# Patient Record
Sex: Female | Born: 1935 | Race: White | Hispanic: No | Smoking: Former smoker
Health system: Southern US, Community
[De-identification: ages and names within clinical notes are randomized; demographics above are authoritative.]

## PROBLEM LIST (undated history)

## (undated) DIAGNOSIS — Z9981 Dependence on supplemental oxygen: Secondary | ICD-10-CM

## (undated) DIAGNOSIS — J449 Chronic obstructive pulmonary disease, unspecified: Secondary | ICD-10-CM

## (undated) DIAGNOSIS — I2510 Atherosclerotic heart disease of native coronary artery without angina pectoris: Secondary | ICD-10-CM

## (undated) DIAGNOSIS — H3320 Serous retinal detachment, unspecified eye: Secondary | ICD-10-CM

## (undated) DIAGNOSIS — H353110 Nonexudative age-related macular degeneration, right eye, stage unspecified: Secondary | ICD-10-CM

## (undated) DIAGNOSIS — I714 Abdominal aortic aneurysm, without rupture, unspecified: Secondary | ICD-10-CM

## (undated) DIAGNOSIS — J189 Pneumonia, unspecified organism: Secondary | ICD-10-CM

## (undated) DIAGNOSIS — G8929 Other chronic pain: Secondary | ICD-10-CM

## (undated) DIAGNOSIS — Z8719 Personal history of other diseases of the digestive system: Secondary | ICD-10-CM

## (undated) DIAGNOSIS — R296 Repeated falls: Secondary | ICD-10-CM

## (undated) DIAGNOSIS — K648 Other hemorrhoids: Secondary | ICD-10-CM

## (undated) DIAGNOSIS — M1990 Unspecified osteoarthritis, unspecified site: Secondary | ICD-10-CM

## (undated) DIAGNOSIS — F329 Major depressive disorder, single episode, unspecified: Secondary | ICD-10-CM

## (undated) DIAGNOSIS — J42 Unspecified chronic bronchitis: Secondary | ICD-10-CM

## (undated) DIAGNOSIS — G2581 Restless legs syndrome: Secondary | ICD-10-CM

## (undated) DIAGNOSIS — I509 Heart failure, unspecified: Secondary | ICD-10-CM

## (undated) DIAGNOSIS — N183 Chronic kidney disease, stage 3 unspecified: Secondary | ICD-10-CM

## (undated) DIAGNOSIS — I739 Peripheral vascular disease, unspecified: Secondary | ICD-10-CM

## (undated) DIAGNOSIS — F419 Anxiety disorder, unspecified: Secondary | ICD-10-CM

## (undated) DIAGNOSIS — I712 Thoracic aortic aneurysm, without rupture, unspecified: Secondary | ICD-10-CM

## (undated) DIAGNOSIS — M519 Unspecified thoracic, thoracolumbar and lumbosacral intervertebral disc disorder: Secondary | ICD-10-CM

## (undated) DIAGNOSIS — I729 Aneurysm of unspecified site: Secondary | ICD-10-CM

## (undated) DIAGNOSIS — I4891 Unspecified atrial fibrillation: Secondary | ICD-10-CM

## (undated) DIAGNOSIS — E782 Mixed hyperlipidemia: Secondary | ICD-10-CM

## (undated) DIAGNOSIS — E611 Iron deficiency: Secondary | ICD-10-CM

## (undated) DIAGNOSIS — G459 Transient cerebral ischemic attack, unspecified: Secondary | ICD-10-CM

## (undated) DIAGNOSIS — Z87442 Personal history of urinary calculi: Secondary | ICD-10-CM

## (undated) DIAGNOSIS — H548 Legal blindness, as defined in USA: Secondary | ICD-10-CM

## (undated) DIAGNOSIS — I10 Essential (primary) hypertension: Secondary | ICD-10-CM

## (undated) DIAGNOSIS — K5790 Diverticulosis of intestine, part unspecified, without perforation or abscess without bleeding: Secondary | ICD-10-CM

## (undated) DIAGNOSIS — G43909 Migraine, unspecified, not intractable, without status migrainosus: Secondary | ICD-10-CM

## (undated) DIAGNOSIS — I350 Nonrheumatic aortic (valve) stenosis: Secondary | ICD-10-CM

## (undated) DIAGNOSIS — M549 Dorsalgia, unspecified: Secondary | ICD-10-CM

## (undated) DIAGNOSIS — K219 Gastro-esophageal reflux disease without esophagitis: Secondary | ICD-10-CM

## (undated) DIAGNOSIS — I779 Disorder of arteries and arterioles, unspecified: Secondary | ICD-10-CM

## (undated) DIAGNOSIS — F32A Depression, unspecified: Secondary | ICD-10-CM

## (undated) HISTORY — PX: TONSILLECTOMY: SUR1361

## (undated) HISTORY — PX: CARDIAC CATHETERIZATION: SHX172

## (undated) HISTORY — DX: Nonrheumatic aortic (valve) stenosis: I35.0

## (undated) HISTORY — DX: Essential (primary) hypertension: I10

## (undated) HISTORY — DX: Iron deficiency: E61.1

## (undated) HISTORY — DX: Gastro-esophageal reflux disease without esophagitis: K21.9

## (undated) HISTORY — DX: Transient cerebral ischemic attack, unspecified: G45.9

## (undated) HISTORY — DX: Disorder of arteries and arterioles, unspecified: I77.9

## (undated) HISTORY — DX: Legal blindness, as defined in USA: H54.8

## (undated) HISTORY — DX: Peripheral vascular disease, unspecified: I73.9

## (undated) HISTORY — PX: BACK SURGERY: SHX140

## (undated) HISTORY — PX: EYE SURGERY: SHX253

## (undated) HISTORY — DX: Unspecified atrial fibrillation: I48.91

## (undated) HISTORY — DX: Unspecified thoracic, thoracolumbar and lumbosacral intervertebral disc disorder: M51.9

## (undated) HISTORY — DX: Atherosclerotic heart disease of native coronary artery without angina pectoris: I25.10

## (undated) HISTORY — PX: TOTAL ABDOMINAL HYSTERECTOMY: SHX209

## (undated) HISTORY — DX: Abdominal aortic aneurysm, without rupture, unspecified: I71.40

## (undated) HISTORY — DX: Chronic obstructive pulmonary disease, unspecified: J44.9

## (undated) HISTORY — PX: APPENDECTOMY: SHX54

## (undated) HISTORY — PX: CARDIAC VALVE REPLACEMENT: SHX585

## (undated) HISTORY — DX: Restless legs syndrome: G25.81

## (undated) HISTORY — DX: Mixed hyperlipidemia: E78.2

## (undated) HISTORY — DX: Other hemorrhoids: K64.8

## (undated) HISTORY — PX: CHOLECYSTECTOMY OPEN: SUR202

## (undated) HISTORY — DX: Abdominal aortic aneurysm, without rupture: I71.4

## (undated) HISTORY — DX: Diverticulosis of intestine, part unspecified, without perforation or abscess without bleeding: K57.90

---

## 1998-11-08 ENCOUNTER — Inpatient Hospital Stay (HOSPITAL_COMMUNITY): Admission: EM | Admit: 1998-11-08 | Discharge: 1998-11-11 | Payer: Self-pay | Admitting: Cardiology

## 1999-12-17 ENCOUNTER — Encounter: Payer: Self-pay | Admitting: Specialist

## 1999-12-17 ENCOUNTER — Encounter: Admission: RE | Admit: 1999-12-17 | Discharge: 1999-12-17 | Payer: Self-pay | Admitting: Specialist

## 2001-03-11 HISTORY — PX: LUMBAR LAMINECTOMY/DECOMPRESSION MICRODISCECTOMY: SHX5026

## 2001-03-21 ENCOUNTER — Encounter: Payer: Self-pay | Admitting: Specialist

## 2001-03-31 ENCOUNTER — Encounter: Payer: Self-pay | Admitting: Specialist

## 2001-03-31 ENCOUNTER — Inpatient Hospital Stay (HOSPITAL_COMMUNITY): Admission: RE | Admit: 2001-03-31 | Discharge: 2001-04-03 | Payer: Self-pay | Admitting: Specialist

## 2001-04-01 ENCOUNTER — Encounter: Payer: Self-pay | Admitting: Specialist

## 2003-08-07 ENCOUNTER — Ambulatory Visit (HOSPITAL_COMMUNITY): Admission: RE | Admit: 2003-08-07 | Discharge: 2003-08-08 | Payer: Self-pay | Admitting: Cardiology

## 2003-09-09 HISTORY — PX: AORTIC VALVE REPLACEMENT: SHX41

## 2003-09-09 HISTORY — PX: MULTIPLE EXTRACTIONS WITH ALVEOLOPLASTY: SHX5342

## 2003-09-10 ENCOUNTER — Encounter: Admission: RE | Admit: 2003-09-10 | Discharge: 2003-09-10 | Payer: Self-pay | Admitting: Dentistry

## 2003-09-12 ENCOUNTER — Ambulatory Visit (HOSPITAL_COMMUNITY): Admission: RE | Admit: 2003-09-12 | Discharge: 2003-09-12 | Payer: Self-pay | Admitting: Dentistry

## 2003-09-12 ENCOUNTER — Encounter (INDEPENDENT_AMBULATORY_CARE_PROVIDER_SITE_OTHER): Payer: Self-pay | Admitting: Specialist

## 2003-09-17 ENCOUNTER — Ambulatory Visit (HOSPITAL_COMMUNITY)
Admission: RE | Admit: 2003-09-17 | Discharge: 2003-09-17 | Payer: Self-pay | Admitting: Thoracic Surgery (Cardiothoracic Vascular Surgery)

## 2003-10-01 ENCOUNTER — Encounter (INDEPENDENT_AMBULATORY_CARE_PROVIDER_SITE_OTHER): Payer: Self-pay | Admitting: *Deleted

## 2003-10-01 ENCOUNTER — Inpatient Hospital Stay (HOSPITAL_COMMUNITY)
Admission: RE | Admit: 2003-10-01 | Discharge: 2003-10-11 | Payer: Self-pay | Admitting: Thoracic Surgery (Cardiothoracic Vascular Surgery)

## 2003-11-13 ENCOUNTER — Ambulatory Visit: Payer: Self-pay | Admitting: Dentistry

## 2004-03-04 ENCOUNTER — Ambulatory Visit: Payer: Self-pay | Admitting: Cardiology

## 2004-06-09 ENCOUNTER — Inpatient Hospital Stay (HOSPITAL_COMMUNITY): Admission: AD | Admit: 2004-06-09 | Discharge: 2004-06-12 | Payer: Self-pay | Admitting: Cardiology

## 2004-06-09 ENCOUNTER — Ambulatory Visit: Payer: Self-pay | Admitting: Cardiology

## 2004-06-10 ENCOUNTER — Encounter: Payer: Self-pay | Admitting: Cardiology

## 2004-06-11 ENCOUNTER — Ambulatory Visit: Payer: Self-pay | Admitting: Pulmonary Disease

## 2004-06-11 ENCOUNTER — Ambulatory Visit: Payer: Self-pay | Admitting: Cardiovascular Disease

## 2004-06-25 ENCOUNTER — Ambulatory Visit: Payer: Self-pay | Admitting: Cardiology

## 2004-07-20 ENCOUNTER — Ambulatory Visit: Payer: Self-pay | Admitting: Internal Medicine

## 2005-04-12 ENCOUNTER — Ambulatory Visit: Payer: Self-pay | Admitting: Cardiology

## 2005-08-31 ENCOUNTER — Ambulatory Visit: Payer: Self-pay | Admitting: Cardiology

## 2005-09-08 ENCOUNTER — Ambulatory Visit: Payer: Self-pay | Admitting: Cardiology

## 2006-06-02 ENCOUNTER — Ambulatory Visit: Payer: Self-pay | Admitting: Cardiology

## 2007-05-02 ENCOUNTER — Ambulatory Visit: Payer: Self-pay | Admitting: Cardiology

## 2007-06-27 ENCOUNTER — Ambulatory Visit: Payer: Self-pay | Admitting: Cardiology

## 2007-12-25 ENCOUNTER — Encounter: Payer: Self-pay | Admitting: Cardiology

## 2008-03-19 ENCOUNTER — Encounter: Payer: Self-pay | Admitting: Cardiology

## 2008-03-26 ENCOUNTER — Encounter: Payer: Self-pay | Admitting: Cardiology

## 2008-07-01 ENCOUNTER — Ambulatory Visit: Payer: Self-pay | Admitting: Cardiology

## 2008-07-15 ENCOUNTER — Ambulatory Visit: Payer: Self-pay | Admitting: Cardiology

## 2008-07-22 ENCOUNTER — Encounter: Payer: Self-pay | Admitting: Cardiology

## 2008-10-04 ENCOUNTER — Encounter: Payer: Self-pay | Admitting: Cardiology

## 2008-11-06 DIAGNOSIS — R0989 Other specified symptoms and signs involving the circulatory and respiratory systems: Secondary | ICD-10-CM

## 2008-11-06 DIAGNOSIS — E785 Hyperlipidemia, unspecified: Secondary | ICD-10-CM | POA: Insufficient documentation

## 2008-11-06 DIAGNOSIS — I2510 Atherosclerotic heart disease of native coronary artery without angina pectoris: Secondary | ICD-10-CM

## 2009-06-15 ENCOUNTER — Encounter: Payer: Self-pay | Admitting: Cardiology

## 2009-06-16 ENCOUNTER — Encounter: Payer: Self-pay | Admitting: Cardiology

## 2009-06-26 ENCOUNTER — Ambulatory Visit: Payer: Self-pay | Admitting: Cardiology

## 2009-06-26 DIAGNOSIS — F17200 Nicotine dependence, unspecified, uncomplicated: Secondary | ICD-10-CM | POA: Insufficient documentation

## 2009-06-26 DIAGNOSIS — R079 Chest pain, unspecified: Secondary | ICD-10-CM | POA: Insufficient documentation

## 2009-06-30 ENCOUNTER — Encounter: Payer: Self-pay | Admitting: Cardiology

## 2010-02-06 ENCOUNTER — Ambulatory Visit: Payer: Self-pay | Admitting: Cardiology

## 2010-02-17 ENCOUNTER — Encounter: Payer: Self-pay | Admitting: Cardiology

## 2010-02-17 DIAGNOSIS — Z952 Presence of prosthetic heart valve: Secondary | ICD-10-CM | POA: Insufficient documentation

## 2010-03-12 NOTE — Assessment & Plan Note (Signed)
Summary: 6 MO FU PER NOV REMINDER   Visit Type:  Follow-up Primary Provider:  Dr. Kirstie Peri   History of Present Illness: 77 year old woman presents for followup. She was seen back in May 2011. At that time she was reporting some chronic musculoskeletal discomfort in her sternal region following prior surgery, and a followup CT scan of the chest was obtained. This indicated no evidence of sternal malunion. There was a subcutaneous nodule noted adjacent to a sternotomy wire, possibly a granuloma and overall nonspecific. Results were forwarded to Dr. Sherryll Burger.  She reports 2 episodes of pneumonia back in the fall, last hospitalization in October. She states she quit smoking at that time, has had a few slips since then, but overall has done fairly well. I discussed with her nicotine replacement p.r.n.   She is not reporting any angina. Reports compliance with her medications. She is due for followup with Dr. Sherryll Burger to have lab work.  Preventive Screening-Counseling & Management  Alcohol-Tobacco     Smoking Status: quit     Packs/Day: 2 PPD     Year Quit: 11/2009     Pack years: 60years  Current Medications (verified): 1)  Aspir-Low 81 Mg Tbec (Aspirin) .... Take 1 Tablet By Mouth Once A Day 2)  Gabapentin 300 Mg Caps (Gabapentin) .... Take 1 Tablet By Mouth Two Times A Day 3)  Furosemide 40 Mg Tabs (Furosemide) .... Take 1 Tablet By Mouth Once A Day 4)  Proventil Hfa 108 (90 Base) Mcg/act Aers (Albuterol Sulfate) .... Two Puffs Two Times A Day 5)  Citalopram Hydrobromide 40 Mg Tabs (Citalopram Hydrobromide) .... Take 1 Tablet By Mouth Once A Day 6)  Simvastatin 40 Mg Tabs (Simvastatin) .... Take 1 Tablet By Mouth Once A Day 7)  Omeprazole 20 Mg Cpdr (Omeprazole) .... Take 1 Tablet By Mouth Two Times A Day 8)  Ropinirole Hcl 0.5 Mg Tabs (Ropinirole Hcl) .... Take 1 Tablet By Mouth Three Times A Day 9)  Isosorbide Mononitrate Cr 30 Mg Xr24h-Tab (Isosorbide Mononitrate) .... Take 1 Tablet By  Mouth Once A Day 10)  Nitrostat 0.4 Mg Subl (Nitroglycerin) .... Use As Directed 11)  Drisdol 16109 Unit Caps (Ergocalciferol) .... Take One By Mouth Biweekly Times 62mths,then Recheck Level 12)  Fish Oil 1000 Mg Caps (Omega-3 Fatty Acids) .... Take 1 Tablet By Mouth Two Times A Day 13)  Hydrocodone-Acetaminophen 5-500 Mg Tabs (Hydrocodone-Acetaminophen) .... Take One By Mouth Every 4hours As Needed  Allergies (verified): 1)  ! Morphine  Comments:  Nurse/Medical Assistant: The patient is currently on medications but does not know the name or dosage at this time. Instructed to contact our office with details. Will update medication list at that time.  Past History:  Past Medical History: Last updated: 06/24/2009 CAD - nonobstructive 2005 C O P D Hyperlipidemia Aortic Stenosis Anxiety G E R D Left eye blindness - detached retina  Past Surgical History: Last updated: 06/24/2009 Abdominal Hysterectomy-Total Appendectomy Back Surgery Cholecystectomy Aortic valve replacement 8/05 - #19 mm Edwards bovine pericardial prosthesis  Social History: Last updated: 06/24/2009 Retired  Widowed  Tobacco Use - Yes Alcohol Use - no  Social History: Smoking Status:  quit Pack years:  60years Packs/Day:  2 PPD  Review of Systems       The patient complains of dyspnea on exertion.  The patient denies anorexia, fever, chest pain, syncope, peripheral edema, prolonged cough, hemoptysis, melena, and hematochezia.         Otherwise reviewed and negative.  Vital Signs:  Patient profile:   77 year old female Height:      61 inches Weight:      137 pounds Pulse rate:   84 / minute BP sitting:   112 / 72  (left arm) Cuff size:   regular  Vitals Entered By: Carlye Grippe (February 17, 2010 2:36 PM)  Physical Exam  Additional Exam:  Chronically ill-appearing elderly woman in no acute distress. HEENT: Conjunctiva and lids normal, oropharynx with poor dentition. Neck: Supple, bilateral  carotid bruits as noted previously, no elevated JVP. Lungs: Diminished breath sounds throughout, nonlabored. Thorax: Mildly tender to palpation of the sternum. Probable palpation of the wire but no gross instability. Cardiac: Regular rate and rhythm, 2/6 systolic murmur, no S3. Abdomen: Soft, nontender, bowel sounds present. Extremities: No pitting edema, distal pulses diminished. Skin: Warm and dry. Musculoskeletal: Mild kyphosis. Neuropsychiatric: Alert and oriented x3, affect appropriate.   EKG  Procedure date:  02/17/2010  Findings:      Normal sinus rhythm at 84 beats per minute.  Impression & Recommendations:  Problem # 1:  CAD, NATIVE VESSEL (ICD-414.01)  No active angina with history of nonobstructive disease. Continue medical therapy and observation.  Her updated medication list for this problem includes:    Aspir-low 81 Mg Tbec (Aspirin) .Marland Kitchen... Take 1 tablet by mouth once a day    Isosorbide Mononitrate Cr 30 Mg Xr24h-tab (Isosorbide mononitrate) .Marland Kitchen... Take 1 tablet by mouth once a day    Nitrostat 0.4 Mg Subl (Nitroglycerin) ..... Use as directed  Orders: EKG w/ Interpretation (93000)  Problem # 2:  AORTIC VALVE REPLACEMENT, HX OF (ICD-V43.3)  History of bioprosthetic aortic valve replacement in 2005, overall stable symptomatically.  Problem # 3:  TOBACCO ABUSE (ICD-305.1)  Patient reports that she quit smoking back in October. We discussed urges, also nicotine replacement.  Problem # 4:  HYPERLIPIDEMIA-MIXED (ICD-272.4)  Due for followup Dr. Sherryll Burger.  Her updated medication list for this problem includes:    Simvastatin 40 Mg Tabs (Simvastatin) .Marland Kitchen... Take 1 tablet by mouth once a day  Patient Instructions: 1)  Your physician wants you to follow-up in: 6 months. You will receive a reminder letter in the mail one-two months in advance. If you don't receive a letter, please call our office to schedule the follow-up appointment. 2)  Your physician recommends that  you continue on your current medications as directed. Please refer to the Current Medication list given to you today.

## 2010-03-12 NOTE — Assessment & Plan Note (Signed)
Summary: 1 yr fu -recv reminder vs   Visit Type:  Follow-up Primary Provider:  Dr. Kirstie Peri   History of Present Illness: 77 year old woman presents for followup. She is here with her granddaughter. She reports no problems with angina. She is chronically short of breath and uses oxygen at nighttime with history of significant lung disease. She unfortunately has not been able to quit smoking. We have discussed this several times.  She reports a chronic musculoskeletal discomfort in her sternal area "ever since surgery."  Recent followup labs in May revealed BUN 9, creatinine 0.8, AST 17, ALT 11, total cholesterol 132, triglycerides 51, HDL 51, LDL 71, TSH 1.4, potassium 4.3, hemoglobin 13.5, platelets 214.  Carotid Dopplers from June 2010 revealed only mild plaque bilaterally, not hemodynamically significant.  She is limited in her functional capabilities and does not exercise.  Preventive Screening-Counseling & Management  Alcohol-Tobacco     Smoking Status: current     Smoking Cessation Counseling: yes     Packs/Day: <1PPD  Current Medications (verified): 1)  Aspir-Low 81 Mg Tbec (Aspirin) .... Take 1 Tablet By Mouth Once A Day 2)  Gabapentin 300 Mg Caps (Gabapentin) .... Take 1 Tablet By Mouth Two Times A Day 3)  Furosemide 40 Mg Tabs (Furosemide) .... Take 1 Tablet By Mouth Once A Day 4)  Proventil Hfa 108 (90 Base) Mcg/act Aers (Albuterol Sulfate) .... Two Puffs Two Times A Day 5)  Citalopram Hydrobromide 40 Mg Tabs (Citalopram Hydrobromide) .... Take 1 Tablet By Mouth Once A Day 6)  Simvastatin 40 Mg Tabs (Simvastatin) .... Take 1 Tablet By Mouth Once A Day 7)  Omeprazole 20 Mg Cpdr (Omeprazole) .... Take 1 Tablet By Mouth Two Times A Day 8)  Ropinirole Hcl 0.5 Mg Tabs (Ropinirole Hcl) .... Take 1 Tablet By Mouth Three Times A Day 9)  Isosorbide Mononitrate Cr 30 Mg Xr24h-Tab (Isosorbide Mononitrate) .... Take 1 Tablet By Mouth Once A Day 10)  Nitrostat 0.4 Mg Subl  (Nitroglycerin) .... Use As Directed 11)  Drisdol 29528 Unit Caps (Ergocalciferol) .... Take One By Mouth Biweekly Times 58mths,then Recheck Level 12)  Fish Oil 1000 Mg Caps (Omega-3 Fatty Acids) .... Take 1 Tablet By Mouth Two Times A Day 13)  Hydrocodone-Acetaminophen 5-500 Mg Tabs (Hydrocodone-Acetaminophen) .... Take One By Mouth Every 4hours As Needed  Allergies: 1)  ! Morphine  Comments:  Nurse/Medical Assistant: The patient's medications and allergies were reviewed with the patient and were updated in the Medication and Allergy Lists. List reviewed.  Past History:  Past Medical History: Last updated: 06/24/2009 CAD - nonobstructive 2005 C O P D Hyperlipidemia Aortic Stenosis Anxiety G E R D Left eye blindness - detached retina  Past Surgical History: Last updated: 06/24/2009 Abdominal Hysterectomy-Total Appendectomy Back Surgery Cholecystectomy Aortic valve replacement 8/05 - #19 mm Edwards bovine pericardial prosthesis  Social History: Last updated: 06/24/2009 Retired  Widowed  Tobacco Use - Yes Alcohol Use - no  Social History: Packs/Day:  <1PPD  Review of Systems       The patient complains of chest pain and dyspnea on exertion.  The patient denies anorexia, fever, syncope, peripheral edema, prolonged cough, headaches, abdominal pain, melena, and hematochezia.         Otherwise reviewed and negative.  Vital Signs:  Patient profile:   77 year old female Height:      61 inches Weight:      147 pounds BMI:     27.88 Pulse rate:   66 /  minute BP sitting:   127 / 72  (left arm) Cuff size:   regular  Vitals Entered By: Carlye Grippe (Jun 26, 2009 11:03 AM)  Nutrition Counseling: Patient's BMI is greater than 25 and therefore counseled on weight management options.  Physical Exam  Additional Exam:  Chronically ill-appearing elderly woman in no acute distress. HEENT: Conjunctiva and lids normal, oropharynx with poor dentition. Neck: Supple,  bilateral carotid bruits as noted previously, no elevated JVP. Lungs: Diminished breath sounds throughout, nonlabored. Thorax: Mildly tender to palpation of the sternum. Probable palpation of the wire but no gross instability. Cardiac: Regular rate and rhythm, 2/6 systolic murmur, no S3. Abdomen: Soft, nontender, bowel sounds present. Extremities: No pitting edema, distal pulses diminished. Skin: Warm and dry. Musculoskeletal: Mild kyphosis. Neuropsychiatric: Alert and oriented x3, affect appropriate.   EKG  Procedure date:  06/26/2009  Findings:      Sinus rhythm at 69 beats per minute with nonspecific ST changes.  Impression & Recommendations:  Problem # 1:  CAD, NATIVE VESSEL (ICD-414.01)  Nonobstructive disease at catheterization prior to valve surgery in 2005, with no active angina. Plan to continue medical therapy and observation with followup over the next 6 months.  Her updated medication list for this problem includes:    Aspir-low 81 Mg Tbec (Aspirin) .Marland Kitchen... Take 1 tablet by mouth once a day    Isosorbide Mononitrate Cr 30 Mg Xr24h-tab (Isosorbide mononitrate) .Marland Kitchen... Take 1 tablet by mouth once a day    Nitrostat 0.4 Mg Subl (Nitroglycerin) ..... Use as directed  Orders: EKG w/ Interpretation (93000) CT Scan without Contrast (CT w/o contrast)  Problem # 2:  HYPERLIPIDEMIA-MIXED (ICD-272.4)  Lipids well-controlled on present regimen.  Her updated medication list for this problem includes:    Simvastatin 40 Mg Tabs (Simvastatin) .Marland Kitchen... Take 1 tablet by mouth once a day  Problem # 3:  BRUIT (ICD-785.9)  Bilateral carotid bruits with no evidence of significant stenosis by serial carotid Dopplers over time.  Problem # 4:  TOBACCO ABUSE (ICD-305.1)  Patient continues to smoke cigarettes. She's been counseled about smoking cessation on many occasions and has been unable to quit.  Problem # 5:  CHEST PAIN UNSPECIFIED (ICD-786.50)  Musculoskeletal, sternal discomfort  since surgery. Could be a wire fracture, doubt sternal nonunion. A noncontrasted chest CT will be scheduled to further investigate this.  Her updated medication list for this problem includes:    Aspir-low 81 Mg Tbec (Aspirin) .Marland Kitchen... Take 1 tablet by mouth once a day    Isosorbide Mononitrate Cr 30 Mg Xr24h-tab (Isosorbide mononitrate) .Marland Kitchen... Take 1 tablet by mouth once a day    Nitrostat 0.4 Mg Subl (Nitroglycerin) ..... Use as directed  Patient Instructions: 1)  Your physician wants you to follow-up in: 6 months. You will receive a reminder letter in the mail one-two months in advance. If you don't receive a letter, please call our office to schedule the follow-up appointment. 2)  Non-Cardiac CT scanning, (CAT scanning), is a noninvasive, special x-ray that produces cross-sectional images of the body using x-rays and a computer. CT scans help physicians diagnose and treat medical conditions. For some CT exams, a contrast material is used to enhance visibility in the area of the body being studied. CT scans provide greater clarity and reveal more details than regular x-ray exams. 3)  Your physician recommends that you continue on your current medications as directed. Please refer to the Current Medication list given to you today.

## 2010-03-18 ENCOUNTER — Encounter: Payer: Self-pay | Admitting: Cardiology

## 2010-06-23 NOTE — Assessment & Plan Note (Signed)
[facility]                          [facility] OFFICE NOTE   ELEXIA, FRIEDT                    MRN:          [id number]  DATE:07/01/2008                            DOB:          1927/12/13    PRIMARY CARE PHYSICIAN:  Kirstie Peri, MD   REASON FOR VISIT:  Routine followup.   HISTORY OF PRESENT ILLNESS:  I saw [name] in May of last year.  She  has a history of aortic stenosis, status post bioprosthetic aortic valve  replacement with documentation of nonobstructive cardiovascular disease.  She does have severe chronic obstructive pulmonary disease with ongoing  tobacco abuse.  I have talked about smoking cessation with her on a  number of occasions.  She denies any problems with exertional angina.  Since I last saw her, she has had declining vision.  She is somewhat  unsteady on her feet and uses a cane.  Although states that she has had  no falls.  I reviewed her medications today, which are largely  unchanged.  She has not had a followup lipid profile and liver function  tests.  Her last echocardiogram was approximately a year and half ago.   ALLERGIES:  MORPHINE.   PRESENT MEDICATIONS:  1. Aspirin 81 mg p.o. daily.  2. Gabapentin 300 mg p.o. b.i.d.  3. Lasix 40 mg p.o. daily.  4. Albuterol twice daily.  5. Citalopram 40 mg p.o. daily.  6. Simvastatin 40 mg p.o. daily.  7. Omeprazole 20 mg p.o. b.i.d.  8. Ropinirole 0.5 mg p.o. t.i.d.  9. Imdur 30 mg p.o. daily.  10.Sublingual nitroglycerin 0.4 mg p.r.n.   REVIEW OF SYSTEMS:  As outlined above.  She has had some left shoulder  difficulty, otherwise reviewed and are negative.   PHYSICAL EXAMINATION:  VITAL SIGNS:  Blood pressure 108/63, heart rate  is 74, and weight is 135 pounds.  GENERAL:  The patient is comfortable in no acute distress.  HEENT:  Conjunctiva is normal.  Oropharynx is clear.  NECK:  Supple.  No elevated jugular venous pressure.  Bilateral carotid  bruits  are noted.  Also, radiation of systolic murmur on the right.  No  thyromegaly.  LUNGS:  Clear with significantly diminished breath sounds.  No wheezing  noted.  CARDIAC:  Regular rate and rhythm with a 3/6 systolic murmur heard at  the base, second heart sound is preserved.  PMI is nondisplaced.  ABDOMEN:  Soft, nontender.  EXTREMITIES:  No frank pitting edema.  Distal pulses are 1+.  SKIN:  Warm and dry.  MUSCULOSKELETAL:  Mild kyphosis noted.  NEUROPSYCHIATRIC:  The patient is alert and oriented x3.  Affect is  appropriate.   A 12-lead electrocardiogram today shows normal sinus rhythm with left  atrial enlargement, nonspecific T-wave changes, and no major changes  compared to her previous tracing from 2009.   IMPRESSION AND RECOMMENDATIONS:  1. Nonobstructive cardiovascular disease with normal ejection      fraction.  Ms. Goetzke is not reporting any significant angina.  Plan will be to continue medical therapy and efforts at risk factor  modification.  1.  Chronic obstructive pulmonary disease with ongoing tobacco abuse.      I continue to recommend smoking cessation.  She has not been able      to quit.  2. Aortic valve disease, status post bioprosthetic aortic valve      replacement.  We will plan a followup 2-D echocardiogram to      reassess valve gradients.  3. Hyperlipidemia, on statin therapy.  Followup lipid profile and      liver function tests will be arranged.  4. Carotid bruits with previous documentation of no significant      obstructive carotid disease as of 2003.  She also has radiation of      a systolic murmur to this area.  To further clarify, we will repeat      carotid Dopplers.     Jonelle Sidle, MD  Electronically Signed    SGM/MedQ  DD: 07/01/2008  DT: 07/02/2008  Job #: [id number]   cc:   Kirstie Peri, MD

## 2010-06-23 NOTE — Assessment & Plan Note (Signed)
[facility]                          [facility]ICE NOTE   [name], [name]                    MRN:          [id number]  DATE:06/27/2007                            DOB:          17-Mar-1927    PRIMARY CARE PHYSICIAN:  Dr. Kirstie Peri.   REASON FOR VISIT:  Cardiac followup.   HISTORY OF PRESENT ILLNESS:  [name] comes in for a routine visit.  She has a history of aortic valve disease, status post bioprosthetic  aortic valve replacement as well as nonobstructive coronary  atherosclerosis.  She was hospitalized briefly back in late March and  early April with early April with atypical chest pain.  She does have  baseline severe chronic obstructive pulmonary disease with continued  tobacco abuse.  She states that overall she has done well.  She has been  working outside some recently in her garden.  Her electromyogram today  shows normal sinus rhythm with left atrial enlargement and nonspecific T-  wave changes.  She did have an echocardiogram done recently on March 24;  this revealed an ejection fraction of 60% to 65% with mild to moderate  left atrial enlargement, trace mitral regurgitation and a stable  bioprosthetic aortic valve with a mean gradient of 22 mmHg and trace  aortic regurgitation.  A trivial pericardial effusion was also noted.  Recent lower extremity arterial studies revealed no evidence of  significant occlusive disease with normal ankle brachial indices.  She  is not having any claudication.   ALLERGIES:  MORPHINE.   PRESENT MEDICATIONS:  1. Gabapentin 300 mg p.o. b.i.d.  2. Lasix 40 mg p.o. daily.  3. Aspirin 81 mg p.o. daily.  4. Albuterol twice daily.  5. Citalopram 40 mg p.o. daily.  6. Simvastatin 40 mg p.o.  7. Omeprazole 20 mg p.o. b.i.d.  8. Requip 0.5 mg p.o. t.i.d.  9. She is also taking both Imdur 30 mg p.o. and isosorbide mononitrate      20 mg p.o. daily.  10.Sublingual nitroglycerin 0.4 mg  p.r.n.   REVIEW OF SYSTEMS:  As described in history of present illness.  No  cough, hemoptysis, fevers, chills.  No lower extremity edema,  palpitations or syncope.  Otherwise negative.   EXAMINATION:  Blood pressure is 120/64, heart rate is 73, weight is  132.8 pounds.  The patient is comfortable in no acute distress.  HEENT:  Conjunctivae are normal.  Oropharynx clear with poor dentition.  NECK:  Supple.  No loud carotid bruits.  LUNGS:  With significantly diminished breath sounds throughout.  No  wheezing or labored breathing.  CARDIAC:  Exam reveals a regular rate and rhythm and 2/6 systolic murmur  at the base, no S3 gallop or loud diastolic murmur.  ABDOMEN:  Soft,  nontender.  EXTREMITIES:  Exhibit no significant pitting edema.   IMPRESSION AND RECOMMENDATIONS:  1. History of aortic valve disease, status post bioprosthetic aortic      valve replacement.  This is stable by recent echocardiography.  2. Nonobstructive coronary disease with preserved ejection fraction.      The patient is denying any angina.  She will continue on aspirin,      Zocor, and Imdur 30 mg daily.  I asked her to discontinue the other      nitroglycerin formulation.  She is not on a beta blocker at this      time, although did tolerate it tolerate in the past.  Her chronic      lung disease limits this.     Jonelle Sidle, MD  Electronically Signed    SGM/MedQ  DD: 06/27/2007  DT: 06/27/2007  Job #: [id number]   cc:   Kirstie Peri, MD

## 2010-06-26 NOTE — Discharge Summary (Signed)
NAME:  [name], [name] NO.:  [id number]   MEDICAL RECORD NO.:  [id number]                   PATIENT TYPE:  OIB   LOCATION:  5524                                 FACILITY:  [facility]   PHYSICIAN:  Learta Codding, M.D. [facility]             DATE OF BIRTH:  30-Oct-1927   DATE OF ADMISSION:  08/07/2003  DATE OF DISCHARGE:  08/08/2003                                 DISCHARGE SUMMARY   SUMMARY OF HISTORY:  [name] is a 77 year old female who was seen in the  office on June 23 for vague-like dizzy spells not associated with any actual  loss of consciousness.  She has not had any chest discomfort, but she does  have dyspnea on exertion which is moderate in intensity.  Dr. Jens Som  initially when he saw her in May referred her for an echocardiogram, which  showed an ejection fraction of 65-75% with grade 1 diastolic dysfunction,  mild MR, moderately calcified aortic valve with evidence of severe aortic  stenosis, with mild to moderate AI.  A Cardiolite showed an EF of 57% with  inferolateral defect, which was partially reversible, suggestive of  ischemia.  With these symptoms and findings on her previous-mentioned tests,  it is felt that she should undergo right and left heart catheterization,  thus her admission.  It is noted that she has a history of a heart  catheterization back in 2000, but the results are not available.   Her history is notable for COPD, hyperlipidemia, and aortic stenosis.   LABORATORY DATA:  At [facility], H&H was 13.7 and 40.5, normal  indices, platelets 305, WBC 5.7.  PT 12.5, PTT 29.  Sodium 138, potassium  4.2, BUN 12, creatinine 0.8, glucose 102.  Chest x-ray showed slight  hyperinflation without evidence of pneumonia or pulmonary edema.  EKG showed  normal sinus rhythm, early R-wave, normal axis, nonspecific ST-T wave  changes.   HOSPITAL COURSE:  Cardiac catheterization was performed on August 07, 2003, by  Dr. Diona Browner.  It is  noted that she has large coronary arteries with minor  irregularities.  She had a 20% proximal RCA and left ventriculogram was not  performed.  Please refer to dictated note for pressures.  Post  catheterization she had been very restless despite bed rest.  Dr. Diona Browner  felt that she needed to be observed overnight to observe for right groin  problems.  Post sheath removal on bed rest, she was moving about without  difficulty.  Dr. Andee Lineman saw the patient on the morning of the 30th and felt  that she could be discharged home.  It was noted that post-procedure H&H and  chemistry are unremarkable.   DISCHARGE DIAGNOSES:  1. Vague dizzy spells.  2. Dyspnea on exertion with a history of aortic stenosis.  History as     previously.   DISPOSITION:  She is discharged home.  She is asked to continue  her home  medications.  These include:   1. Mirapex 0.25 mg daily.  2. Vitamin E 400 international units daily.  3. Neurontin 300 mg q.h.s.  4. Celebrex 200 mg p.r.n.  5. Citalopram 20 mg b.i.d.  6. Aspirin 81 mg b.i.d.  7. Calcium with vitamin D 600 mg daily.  8. Detrol LA 4 mg daily.  9. Advair 250/50 mcg b.i.d.  10.      Hydrocodone as needed.   She was instructed no lifting, driving, sexual activity, or heavy exertion  for two days.  Maintain low salt, fat, and cholesterol diet.  If she has any  problems with her catheterization site, she was asked to call.  She will  follow up with Dr. Diona Browner in the [place] office on July 12 at 3 p.m.  According to Dr. Ival Bible note post catheterization, he will arrange an  outpatient CVTS referral in regard to her aortic stenosis.      Joellyn Rued, P.A. [facility]                    Learta Codding, M.D. [facility]    EW/MEDQ  D:  08/08/2003  T:  08/08/2003  Job:  [id number]   cc:   Dr. Teryl Lucy, [place]   Jonelle Sidle, M.D. [facility]   CVTS

## 2010-06-26 NOTE — Op Note (Signed)
NAME:  [name], [name]                       ACCOUNT NO.:  [id number]   MEDICAL RECORD NO.:  [id number]                   PATIENT TYPE:  AMB   LOCATION:  DAY                                  FACILITY:  [facility]   PHYSICIAN:  Charlynne Pander, D.D.S.          DATE OF BIRTH:  14-Oct-1927   DATE OF PROCEDURE:  09/12/2003  DATE OF DISCHARGE:                                 OPERATIVE REPORT   PREOPERATIVE DIAGNOSES:  1. Aortic stenosis.  2. Preaortic valve replacement heart surgery dental protocol.  3. Chronic periodontitis.  4. Chronic apical periodontitis.  5. Accretions.  6. Pigmented soft tissue in the edentulous alveolar ridge area of tooth #3.   POSTOPERATIVE DIAGNOSES:  1. Aortic stenosis.  2. Preaortic valve replacement heart surgery dental protocol.  3. Chronic periodontitis.  4. Chronic apical periodontitis.  5. Accretions.  6. Pigmented soft tissue in the edentulous alveolar ridge area of tooth #3.   OPERATIONS:  1. Dental examination.  2. Extraction of tooth #3 23, 24, 25, 26 and 29.  3. Two quadrants of alveoloplasty.  4. Gross debridement of the remaining dentition.  5. Soft tissue biopsy of the edentulous alveolar ridge area #3.   SURGEON:  Charlynne Pander, D.D.S.   ASSISTANT:  1. Elliot Dally (Sales executive).  2. Ascencion Dike (dental student).   ANESTHESIA:  1. Monitored anesthesia care per the anesthesia team.  2. Local anesthesia with total utilization of carpules, each containing 54     mm of mepivacaine with no epinephrine as well as two carpules containing     36 mm of Xylocaine with 0.018 mg of epinephrine.   MEDICATIONS:  Ampicillin 2.0 gm IV prior to invasive dental procedures.   SPECIMENS:  1. There were 5 teeth which were discarded.  2. There was a 2x2 mm soft tissue biopsy of the pigmented area of the     edentulous alveolar ridge area of tooth #3.   DRAIN SITES/CULTURES:  None.   COMPLICATIONS:  None.   FLUIDS:  400 mL lactated  Ringer's solution.   ESTIMATED BLOOD LOSS:  Less than 25 mL.   INDICATIONS:  Patient had a history of critical aortic stenosis.  The  patient with anticipated aortic valve replacement heart surgery in the  future with Dr. Tressie Stalker.  Patient was examined and treatment planned  for extraction of multiple teeth with alveoloplasty, gross debridement of  the remaining dentition, and soft tissue biopsy of the pigmented area  previously described.  This treatment plan was formulated to decrease the  risks and complications associated with dental infection from affecting the  patient's systemic health and anticipated heart valve surgery.   OPERATIVE FINDINGS:  The patient was examined in operating room #7.  The  teeth were identified for extraction.  The patient was also noted to have a  pigmented area on the edentulous alveolar ridge in the area previously  occupied by tooth #3.  It  was determined that this area would need to be  biopsied to rule out pathology even though the clinical impression was that  of an amalgam tattoo.  The patient was also noted to be affected by chronic  periodontitis, chronic apical periodontitis, accretions, and the presence of  that soft tissue lesion.   DESCRIPTION OF PROCEDURE:  The patient was brought to the main operating  room #7.  The patient was placed in the supine position on the operating  room table.  Monitored anesthesia care was induced per the anesthesia team.  The patient was then prepped and draped in the usual manner for a dental  medicine procedure.  The oral cavity was thoroughly examined with the  findings as noted above.  The patient was then readied for the dental  medicine procedure as follows:   Local anesthesia was administered sequentially over the 1 hour long  procedure.  A total utilization of two carpules containing 54 mg of  mepivacaine with no epinephrine as well as two carpules each containing 36  mg of Xylocaine with 0.018  mg of epinephrine was administered in total.   The mandibular left and right quadrants were first approached.  The patient  was anesthetized with inferior alveolar nerve blocks.  The soft tissues of  the mandibular left and mandibular right quadrants were then anesthetized  with infiltration with local anesthetic with epinephrine.  The mandibular  anterior teeth were then approached.  A Woodson elevator was utilized to  remove the soft tissue from the hard tissue around tooth #s 23 through 26 as  well as 29.  These teeth were then subluxated with a series of __________  elevators.  These teeth were then removed with a 151 forceps without  complications.  The sockets were curetted and compressed appropriately.  Alveoloplasty was then performed utilizing rongeurs.  The surgical site was  then irrigated with copious amounts of sterile saline.  The surgical site  was then closed from the mesial of #22 through the mesial of #27 utilizing 3-  0 chromic gut suture in a continuous interrupted suture technique x1.  The  surgical site #29 was then closed with a figure-of-eight suture technique  and 3-0 chromic gut material.   The remaining teeth were then approached.  A KaVo Sonic scaler was then  utilized to remove accretions.  A series of hand curettes were then utilized  to remove further accretions.  The KaVo Sonic scaler was then again utilized  to further remove accretions.  At this point in time, dental caries were  noted to be affecting tooth #22 and both the mesial and distal.  Caries were  also noted to be affecting the buccal aspect of tooth #32.  These teeth will  be restored in the future as an outpatient.  At this point in time the  entire mouth was irrigated with copious amounts of sterile saline.  The  pigmented area associated with the edentulous alveolar ridge in the area of tooth #3 was then visualized.  An elliptical excisional biopsy was then made  of this area.  This was  submitted to pathology to rule out malignant  melanoma, although the clinical impression is that of an amalgam tattoo.  This surgical site was then irrigated with copious amounts of sterile saline  and closed with two separate interrupted sutures and 3-0 chromic gut  material.  At this point in time, the entire mouth was again irrigated with  copious amounts of sterile saline.  The patient was examined for  completions, seeing none, the dental medicine procedure was deemed to be  complete.  The patient was then handed over to the anesthesia team for final  disposition.  After an appropriate amount of time the patient was taken to  the postanesthesia care unit with stable vital signs and good oxygenation  level.  The patient will be returning to the clinic for evaluation for  suture removal in approximately one week.  If there is time, dental  restorations of teeth #s 22 and 32 will be achieved prior to the heart valve  surgery.  If this is not possible, these restorations will take place once  the patient is medically stable from the heart valve surgery.                                               Charlynne Pander, D.D.S.    RFK/MEDQ  D:  09/12/2003  T:  09/12/2003  Job:  [id number]   cc:   Salvatore Decent. Cornelius Moras, M.D.  [address]  [place]  [place] [address]   Jonelle Sidle, M.D. [facility]

## 2010-06-26 NOTE — Discharge Summary (Signed)
NAME:  [name], [name]                       ACCOUNT NO.:  [id number]   MEDICAL RECORD NO.:  [id number]                   PATIENT TYPE:  INP   LOCATION:  2005                                 FACILITY:  [facility]   PHYSICIAN:  Salvatore Decent. Cornelius Moras, M.D.              DATE OF BIRTH:  [birth year]   DATE OF ADMISSION:  10/01/2003  DATE OF DISCHARGE:  10/11/2003                                 DISCHARGE SUMMARY   ADDENDUM:  Initially, it was anticipated that Ms. Lauman would be discharged to either  subacute care unit or nursing home facility for short-term rehabilitation on  October 10, 2003.  This was due primarily to the fact that the family was  quite concerned about her postoperative delirium.  This was felt due  secondary to ICU psychosis and sleep deprivation.  Within two days of being  transferred out of the intensive care unit her delirium had resolved.  She  denied any hallucinations.  Despite this the family was still quite  concerned and somewhat anxious regarding caring for her at home.  Based on  this, a rehabilitation consult was ordered.  However, at that time there was  no bed available.  There was also some question that she may be doing too  well to require subacute care.  Based on this, short-term skilled nursing  facility care was looked into by the case manager.  However, upon further  discussion with the son he did ultimately decide that he wanted to care for  his mother after discharge.  Her children were much more comfortable with  her postoperative care since her confusion had resolved.  As an added  precaution, a head CT was ordered which showed mild chronic microvascular  white matter disease but otherwise there was no acute intracranial findings.   As stated above, it was initially anticipated that she would go home on  September 1.  However, that morning during rounds she was noted to have a  low grade temperature overnight.  Based on this, complete blood count  and  urinalysis was ordered.  Otherwise, there was no obvious signs of infection.  She did report a dry cough for the last few days.  She denied dysuria.  The  above laboratories were essentially normal showing a white blood count of  6.3, hemoglobin 10.8, hematocrit 31.6, and platelet count of 525.  Her  urinalysis did have trace leukocytes.  The microscopic examination was  negative.  She did remain hospitalized for an additional day for monitoring,  however.  On the morning of October 11, 2003 there was no significant  change.  Overall, she had been afebrile with the temperature maximum  overnight of 99.8.  However, her dry cough had now developed into a  productive cough with a thin yellow sputum.  Her lung sounds were also more  coarse, particularly in the right lower lobe.  Otherwise, there was no  change in  her breathing status and she continued to deny shortness of  breath, but did continue to have mild dyspnea on exertion.  She had no chest  pain.  It was felt that she might be developing an early bronchitis.  After  discussion with Dr. Cornelius Moras it was felt that she would still be stable for  discharge home later that day.  However, she was started on a seven day  course of Avelox to cover for possible bronchitis.  Otherwise, Ms. Judon  was doing quite well.  She was making great progress with ambulation.  Her  gait was steady and her distance was increasing.  She was maintaining sinus  rhythm and her blood pressure was 120/60 on low dose Altace and Toprol.  She  was saturating 90-92%.  Her incisions continued to heal without signs of  infection.  Based on her progress, she was felt stable for discharge and she  was discharged to her son's home where he would assist with 24-hour care.  A  home health nurse, aide, and physical therapist were arranged by the case  management via [facility].  She was discharged home in improved  condition.   DISCHARGE INSTRUCTIONS:    DISCHARGE MEDICATIONS:  1.  Advair Diskus at her home dose one puff b.i.d.  2.  Calcium supplement at her home dose daily.  3.  Celebrex 200 mg one p.o. daily.  4.  Mirapex 0.25 mg one p.o. daily.  5.  Detrol LA 4 mg one p.o. daily.  6.  Enteric-coated aspirin 325 mg one p.o. daily.  7.  Zetia 10 mg one p.o. daily.  8.  Neurontin 300 mg p.o. t.i.d. and q.h.s.  9.  Celexa 20 mg one p.o. b.i.d.  10. Vitamin E supplements at home dose daily.  11. Albuterol inhaler two puffs q.4h. p.r.n. shortness of breath or      wheezing.  12. Toprol XL 25 mg one p.o. daily.  13. Altace 2.5 mg one p.o. daily.  14. Avelox 400 mg one p.o. daily x7 days.  15. Ultram 50 mg one to two tablets p.o. q.4-6h. p.r.n. for pain.   DISCHARGE INSTRUCTIONS:  She is instructed to avoid driving, heavy lifting  greater than 10 pounds, or strenuous exercise.  She was encouraged to  continue daily walking and breathing exercises.  She is to follow a low fat,  low salt diet.  She may shower daily with soap and water and was told to  notify the CVTS office if she develops fever greater than 101 or redness or  drainage from her incision site, for increased lower extremity edema, or for  worsening shortness of breath.   FOLLOWUP:  She is to follow up with Dr. Tressie Stalker at the CVTS office on  November 25, 2003 at 12:30 p.m.  She is to call and schedule two week follow-  up with Dr. Nona Dell.  She will have a chest x-ray taken at this  appointment and was instructed to bring her chest x-ray films with her to  her follow-up appointment with Dr. Cornelius Moras.      Jerold Coombe, P.A.                  Salvatore Decent. Cornelius Moras, M.D.    AWZ/MEDQ  D:  10/11/2003  T:  10/13/2003  Job:  [id number]   cc:   Jonelle Sidle, M.D. [facility]   Wende Crease, M.D.

## 2010-06-26 NOTE — Discharge Summary (Signed)
NAMEKASSADIE, PANCAKE NO.:  [id number]   MEDICAL RECORD NO.:  [id number]          PATIENT TYPE:  INP   LOCATION:  3707                         FACILITY:  [facility]   PHYSICIAN:  Jonelle Sidle, M.D. LHCDATE OF BIRTH:  01-29-1928   DATE OF ADMISSION:  06/09/2004  DATE OF DISCHARGE:  06/12/2004                                 DISCHARGE SUMMARY   PROCEDURE:  1.  Cardiac catheterization Jun 10, 2004.  2.  2-D echocardiogram.   REASON FOR ADMISSION:  Ms. Wolgamott is a 77 year old female, status post bio-  prosthetic aortic valve replacement August 2005 secondary to severe aortic  stenosis, with no known history of significant coronary artery disease, who  initially presented to [facility] with sudden dyspnea, no associated  chest discomfort, but abnormal troponin markers. She was referred to Dr. Simona Huh for further evaluation. Please refer to Dr. Ival Bible consultation  for full details.   LABORATORY DATA:  Normal metabolic profile on admission and in followup.  Normal CBC on admission. D-dimer is 0.55. Lipid profile:  Total cholesterol  180, triglycerides 205, HDL 38, LDL 101.   HOSPITAL COURSE:  Following a transfer from [facility], arrangements  were made for patient to proceed with coronary angiography, performed the  following day by Dr. Bonnee Quin (see referral for full details), which  revealed non-obstructive coronary artery disease and mild perivalvular leak.  Dr. Riley Kill recommended continued medical therapy, smoking cessation, and  aggressive management of chronic obstructive pulmonary disease. The patient  was referred to Dr. Danice Goltz for pulmonary evaluation, who noted the  patient had significant gastroesophageal reflux disease, which was quite  symptomatic. She recommended twice daily Protonix, aggressive pulmonary  toilet, smoking cessation, and followup with her primary care physician  following discharge. The patient  also had a 2-D echocardiogram during her  brief stay. This revealed good left ventricular function, mild mid-cavity  gradient secondary to vigorous motion, stable bio-prosthetic valve, with  normal gradients. The patient was also referred for a CT scan of the chest,  following minimally elevated D-dimer (0.55); this revealed no evidence of  pulmonary emboli. No further workup was recommended and patient was cleared  for discharge. She was started on low dose Zocor at discharge.   DISCHARGE MEDICATIONS:  1.  Prednisone taper 20 mg q.d. (x2 days), then 10 mg q.d. (x4 days), then      stop.  2.  Protonix 40 mg b.i.d.  3.  Advair 500/50 mg b.i.d.  4.  Albuterol MDI 2 puffs q. 4 hours with spacer p.r.n.  5.  Celexa 40 mg q.d.  6.  Neurontin 300 mg q. a.m. and 600 mg q. p.m.  7.  Spiriva 1 capsule q.d.  8.  Lopressor 25 mg b.i.d.  9.  Mirapex 0.25 mg b.i.d.  10. Guaifenesin 400 mg t.i.d.  11. Coated aspirin 81 mg q.d.  12. Zocor 20 mg q.h.s.   SPECIAL INSTRUCTIONS:  The patient is to call our office if there is any  swelling or bleeding of the groin.   FOLLOW UP:  The patient will followup with  Dr. Doreatha Martin McDowell/Mike Duran,  P.A.C. on Thursday, Jun 25, 2004 at 2:50 p.m. at [facility] in [place].  The patient is instructed to arrange followup with her primary care  physician, Dr. Wende Crease, in the following 1/2 to 2 weeks.   DISCHARGE DIAGNOSES:  1.  Dyspnea.      1.  Multi-factorial etiology including chronic obstructive pulmonary          disease.  2.  Non-obstructive coronary artery disease.      1.  Cardiac catheterization Jun 10, 2004.      2.  Abnormal troponin markers.  3.  Bio-prosthetic aortic valve replacement August 2005.      1.  Secondary to severe aortic stenosis.  4.  Tobacco.  5.  Hyperlipidemia.  6.  History of postoperative paroxysmal atrial fibrillation.      GS/MEDQ  D:  06/12/2004  T:  06/13/2004  Job:  [id number]   cc:   Jonelle Sidle, M.D.  [facility]   Wende Crease, M.D.  [facility]   Danice Goltz, M.D. [facility]   [facility]  [address]  [address]  [place] [address]   Suszanne Conners. Duran, P.A.

## 2010-06-26 NOTE — Discharge Summary (Signed)
NAME:  [name], [name]                       ACCOUNT NO.:  [id number]   MEDICAL RECORD NO.:  [id number]                   PATIENT TYPE:  INP   LOCATION:  2005                                 FACILITY:  [facility]   PHYSICIAN:  Salvatore Decent. Cornelius Moras, M.D.              DATE OF BIRTH:  1927/05/13   DATE OF ADMISSION:  10/01/2003  DATE OF DISCHARGE:                                 DISCHARGE SUMMARY   ANTICIPATED DATE OF DISCHARGE:  October 10, 2003   ADMISSION DIAGNOSIS:  Severe aortic stenosis.   ADDITIONAL/DISCHARGE DIAGNOSES:  1.  Severe aortic stenosis status post aortic valve replacement with a 19 mm      Carpentier-Edwards pericardial bioprosthetic valve, completed on October 01, 2003.  2.  Postoperative paroxysmal atrial fibrillation, converted to normal sinus      rhythm on diltiazem.  3.  Postoperative delirium, resolving at the time of discharge.  A CT scan      of the head is planned for the evening of October 09, 2003.  The results      of this are pending at the time of this dictation.  If there are any      significant findings, these will be dictated in an addendum.  4.  Chronic obstructive pulmonary disease.  5.  Hyperlipidemia.  6.  Restless legs syndrome.  7.  Chronic back and lower extremity pain with degenerative disk disease.  8.  Diverticulosis.  9.  Partial blindness with a macular degeneration.  10. History of lower back surgery in 2003.  11. Cholecystectomy in 1987.  12. History of appendectomy in the distant past.  13. History of hysterectomy in the distant past.   HOSPITAL MANAGEMENT/PROCEDURES:  1.  Aortic valve replacement with a 19 mm Edwards bovine pericardial tissue      prosthesis, completed by Dr. Cornelius Moras on October 01, 2003.  2.  Intraoperative transesophageal echocardiography completed on October 01, 2003.  This showed the aortic valve to be seated well after bypass.      There was no evidence of aortic valvular regurgitation.  There was mild  stenosis in the trace to 1+ range.  The left ventricle remained      excellent in its contractility.  3.  Initiation of cardiac rehab Phase I.  4.  Antiarrhythmic therapy for postoperative paroxysmal atrial fibrillation.   CONSULTATIONS:  1.  Case management.  2.  Physical medicine and rehabilitation.  3.  Cardiac rehab.   HISTORY OF PRESENT ILLNESS:  [name] is a 77 year old, widowed, white  female from [place], [place] who lives with her granddaughter and  several other family members.  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  The patient had been followed with a  known history of aortic stenosis  as well as a history of  hypercholesterolemia and emphysema.  The patient presented with progressive  symptoms of exertional shortness of breath to her primary physician.  The  patient was seen in follow up by Dr. Jens Som in May of 2005 and a follow up  echocardiogram was performed demonstrating severe aortic stenosis with peak  and mean transvalvular gradient of 100 to 160 mmHg respectively.  Her  transvalvular velocity across the aortic valve ranges between 4.2 and 5.2  meters per second corresponding with calculated aortic valve area between  0.7 and 0.8 cm square.  The patient's left ventricular function was  preserved, although there was presence of grade 1 diastolic dysfunction.  Her left ventricular size and left atrial size were both normal, although  there was mild left ventricular hypertrophy.   The patient returned for follow up to see Dr. Diona Browner in June and  subsequently underwent elective left and right heart catheterization on June  29th.  This confirmed the presence of severe aortic stenosis with a  calculated aortic valve area of 0.7 cm squared and a peak-to-peak gradient  across the valve ranging between 35 and 49 mmHg respectively.  Right heart   catheterization was notable for PA pressures of 28/13 with a pulmonary  capillary wedge pressure of 10.  Coronary arteriography was notable for the  absence of any significant coronary artery disease.  The patient was then  subsequently referred to Dr. Cornelius Moras of [facility] for possible elective aortic valve  replacement.  The patient was seen in consultation then by Dr. Cornelius Moras on  September 09, 2003.  His impression was that indeed the patient had severe  aortic stenosis with progressive symptoms of exertional shortness of breath,  chest pain, both at rest and with exertion as well as dizzy spells.  He  believes the patient would best be treated by elective aortic valve  replacement.  He favored the use of a bioprosthetic tissue valve given her  age and associated issues with long-term tobacco abuse in the presence of  COPD.  The risks, benefits, and alternatives to the procedure were discussed  with the patient and her family in great detail at that time.  They were in  understanding and agreed to proceed with surgery.  The patient was scheduled  for a consultation with Dr. Kristin Bruins of the dental service prior to surgery.   HOSPITAL COURSE:  [name] was then admitted electively to [facility] on October 01, 2003.  The patient was taken to the operating room  and underwent aortic valve replacement with a 19 mm Carpentier-Edwards  pericardial bioprosthesis.  Intraoperative transesophageal echocardiogram  showed that the valve was well seated after cardiopulmonary bypass was taken  off.  There was no evidence of regurgitation.  There was mild stenosis in  the trace to 1+ range.  Overall, the patient tolerated this procedure well  and was transferred to the surgical intensive care unit in critical but  stable condition.  The patient was extubated successfully later that  evening.  She awoke from anesthesia neurologically intact.  The patient's postoperative course was complicated by paroxysmal  atrial  fibrillation, with the onset on postop day #1.  The patient was treated with  IV diltiazem and quickly converted to normal sinus rhythm.  The patient  remained in a normal sinus rhythm for the rest of her hospitalization and  was maintained on Toprol-XL.   In addition, the patient was slow to wean from supplemental oxygen and  continued to have pulmonary issues.  She has known COPD prior to surgery.  She has known COPD preoperatively and these difficulties were anticipated.  The patient was given an aggressive pulmonary toilet regimen as well as  nebulizer and inhaler therapies.  Overall, the patient continued to make  slow but steady progress towards recovery.  There was no evidence of  postoperative pneumonia.  By the time of discharge planning, the patient's  SpO2 was in the 90s to 95% range on room air.   In addition, the patient did show signs and symptoms of postoperative  delirium while in the intensive care unit.  This improved over the next  several hospital days.  The patient's family continued to be quite concerned  regarding the patient's change in mental status.  A CT scan of the head was  then ordered to rule out CVA.  This was ordered on the evening of October 09, 2003 and the results are pending at the time of this dictation.  Overall,  from a surgical standpoint, the patient continued to make good progress.   Otherwise, the patient continued to make steady progress toward recovery.  She had resumed a normal diet and was tolerating this without difficulty.  She had resumed normal bowel and bladder function.  Her pain was well  controlled with oral medications.  She was ambulating in the hallways with  cardiac rehab with assistance.  Her incisions were healing well without  evidence of infection.  She had remained afebrile and hemodynamically  stable.  Her blood pressure was trending somewhat low, therefore her Lasix  and Altace were discontinued.  The patient was  placed on Toprol-XL 25 mg  daily for discharge.  The patient's pulmonary status was markedly improved  with an SpO2 of 92% on room air.  Her lungs revealed faint inspiratory  wheezes in the right lower lobe, otherwise they were clear.  Her abdomen was  soft, nontender, nondistended with good bowel sounds.  Her extremities were  without edema.  The patient was deemed ready and appropriate for initiation  of discharge planning on October 09, 2003.   LABORATORY VALUES AT THE TIME OF DISCHARGE PLANNING:  BMETs from August 30th  reads sodium of 132, potassium of 4.4, chloride of 100, CO2 of 24, glucose  of 103, BUN of 15, creatinine of 1.1, and calcium of 8.6.  CBC from August  30th reads WBC 8.1, hemoglobin of 9.9, hematocrit of 29.3, and platelets of  334.   Chest x-ray completed on August 30th reads continued bibasilar atelectasis  and effusions on the right greater than the left.  COPD changes are unchanged and present.  There is improved aeration of both lung bases.   DISPOSITION:  Ms. Din will be discharged as planned on October 10, 2003  to a skilled nursing facility, pending a.m. rounds and no change in the  patient's clinical status.  The patient's family is still in the process of  discussing the specific skilled nursing facility that they would agree with  the patient being discharged for short-term.   DISCHARGE MEDICATIONS:  1.  Advair Diskus 2 puffs daily.  2.  Calcium supplements daily.  3.  Celebrex 200 mg daily.  4.  Mirapex 0.25 mg daily.  5.  Detrol LA 4 mg daily.  6.  Aspirin 325 mg daily.  7.  Zetia 10 mg daily.  8.  Neurontin 300 mg three times daily and q.h.s.  9.  Celexa 20 mg twice daily.  10. Vitamin E supplement daily.  11. Toprol-XL 25 mg q.a.m.  12. Ultram 50 mg one to two tablets every 4 to 6 hours as needed for pain.   DISCHARGE INSTRUCTIONS:  1.  Activity:  The patient should avoid driving.  She should avoid heavy      lifting or strenuous  activity.  She should continue to walk daily.  She      should continue her breathing exercises for one more week.  The patient      should be evaluated by physical therapy and occupational therapy upon      admission to the skilled nursing facility and should follow strict      sternal precautions.  2.  Diet:  The patient is to follow a low fat, low cholesterol, heart      healthy diet.  3.  Wound care:  The patient may shower.  She should wash her incisions      daily with soap and water.  She should notify the [facility] office if she has      any redness, swelling, or drainage from her incision site.   FOLLOW UP APPOINTMENTS:  The patient is scheduled to see Dr. Cornelius Moras on October  17th at 12:30 p.m.  The patient is scheduled for a PA and lateral chest x-  ray at [facility] one hour prior to her appointment with  Dr. Cornelius Moras.  Otherwise the patient is instructed to notify the [facility] office if  she has any questions or concerns in the meantime.      Carolyn A. Eustaquio Boyden.                  Salvatore Decent. Cornelius Moras, M.D.    CAF/MEDQ  D:  10/09/2003  T:  10/09/2003  Job:  [id number]   cc:   Jonelle Sidle, M.D. [facility]   Wende Crease, M.D.

## 2010-06-26 NOTE — Cardiovascular Report (Signed)
NAME:  Darlene, Cochran NO.:  [id number]   MEDICAL RECORD NO.:  [id number]                   PATIENT TYPE:  OIB   LOCATION:  2857                                 FACILITY:  [facility]   PHYSICIAN:  Jonelle Sidle, M.D. [facility]        DATE OF BIRTH:  03-24-1927   DATE OF PROCEDURE:  08/07/2003  DATE OF DISCHARGE:                              CARDIAC CATHETERIZATION   PRIMARY CARE PHYSICIAN:  Dr. Doyne Keel.   PROCEDURES PERFORMED:  1. Left heart catheterization.  2. Right heart catheterization.  3. Selective coronary angiography.   CARDIOLOGIST:  Jonelle Sidle, M.D.   INDICATIONS:  [name] is a 77 year old woman with a history of COPD,  dyslipidemia and severe aortic stenosis documented at recent echocardiogram.  She has also had a recent Cardiolite showing potential inferolateral  ischemia with overall normal ejection fraction of 57%.  She is referred for  hemodynamic evaluation of her aortic stenosis as well as definition of her  coronary anatomy.   ACCESS AND EQUIPMENT:  The area about the right femoral artery and vein was  anesthetized with 1% lidocaine.  A 7 French sheath was placed in the right  femoral artery and a 8 French sheath was placed in the right femoral vein;  both via the modified Seldinger technique.  Standard preformed JL-4 and JR-4  catheters were used for selective coronary angiography.  The JR-4 catheter  was also used for left heart catheterization using a straight wire.  A  standard 7.5 French flow-directed  balloon-tipped catheter was used for  right heart catheterization and hemodynamic assessment.  All exchanges were  made over a wire.   The patient tolerated the procedure well without immediate complications.   HEMODYNAMIC DATA:  Right atrial mean 7.  Right ventricle 34/7.  Pulmonary  artery 28/13.  Pulmonary capillary wedge pressure mean of 10.  Left  ventricle 225/15.  Aorta 178/65.  Cardiac output 4.1 by the  thermodilution  method.  Cardiac index 2,3 by the thermodilution method.  Aortic valve area  calculated at 0.7 square centimeters by the thermodilution method. Peak-to-  peak gradient ranged from 35-44 mmHg based on a variety of measurements.   ANGIOGRAPHIC FINDINGS:  1. Left Main Coronary Artery:  The left main coronary artery is free of     significant flow-limiting coronary atherosclerosis.   1. Left Anterior Descending:  The left anterior descending is free of     significant flow-limiting coronary atherosclerosis.  There are two     diagonal branches noted.   1. Circumflex Coronary Artery:  The circumflex coronary artery is free of     significant flow-limiting coronary atherosclerosis.   1. Right Coronary Artery:  The right coronary artery has a proximal stenosis     and other minor luminal irregularities.  This is a dominant vessel.   Left ventriculography was not performed.   DIAGNOSES:  1. Severe aortic stenosis with a calculated valve area of  0.7 square     centimeters and a peak-to-peak gradient ranging from 35-49 mmHg.  2. No evidence of significant pulmonary hypertension with normal cardiac     output.  3. No significant flow-limiting coronary atherosclerosis in the major     epicardial vessels, which are, in general, fairly large.   RECOMMENDATIONS:  We will ultimately plan referral to [facility] to discuss  potential valve replacement.                                               Jonelle Sidle, M.D. [facility]    SGM/MEDQ  D:  08/07/2003  T:  08/07/2003  Job:  [id number]

## 2010-06-26 NOTE — Op Note (Signed)
NAME:  [name], [name]                       ACCOUNT NO.:  [id number]   MEDICAL RECORD NO.:  [id number]                   PATIENT TYPE:  INP   LOCATION:  2305                                 FACILITY:  [facility]   PHYSICIAN:  Salvatore Decent. Cornelius Moras, M.D.              DATE OF BIRTH:  [birth year]   DATE OF PROCEDURE:  10/01/2003  DATE OF DISCHARGE:                                 OPERATIVE REPORT   PREOPERATIVE DIAGNOSIS:  Severe aortic stenosis.   POSTOPERATIVE DIAGNOSIS:  Severe aortic stenosis.   PROCEDURE:  Median sternotomy for aortic valve replacement (#19-mm Edwards  bovine pericardial tissue prosthesis).   SURGEON:  Salvatore Decent. Cornelius Moras, M.D.   ASSISTANT:  Evelene Croon, M.D.   ANESTHESIA:  General.   BRIEF CLINICAL NOTE:  The patient is a 77 year old, widowed white female  from  [place], [place] who is known to have a history of  aortic  stenosis.  She now presents with progressive symptoms of exertional  shortness of breath as well as new onset of dizzy spells.  Echocardiogram  demonstrates severe aortic stenosis with peak and mean transvalvular  gradients estimated 100 and 60 mmHg, respectively.  Her calculated aortic  valve area is estimated at 0.7 cm squared.  The patient subsequently  underwent cardiac catheterization confirming the presence of severe aortic  stenosis with calculated aortic valve area of 0.7 cm squared.  The patient  is noted to have normal coronary arteries with no significant coronary  artery disease.  Left ventricular function is preserved, although there is  significant diastolic dysfunction due to left ventricular hypertrophy.  A  full consultation note has been dictated previously.   OPERATIVE CONSENT:  The patient and her family had been counseled at length  regarding the indications and potential benefits of aortic valve  replacement.  Alternative treatment strategies have been discussed including  and most notably continued medical therapy versus  proceeding with surgery at  this juncture.  They understand and accept all associated risks of surgery  and desire to proceed as described.   OPERATIVE FINDINGS:  Severe calcific aortic stenosis with at least moderate  aortic insufficiency, normal left ventricular systolic function, moderate-to-  severe left ventricular hypertrophy.   OPERATIVE NOTE IN DETAIL:  The patient is brought to the operating room on  the above-mentioned date, and central monitoring is established by the  anesthesia service under the care and direction of Sheldon Silvan, M.D.  Specifically, a Swan-Ganz catheter is placed through the right internal  jugular approach.  A radial arterial line is placed.  Intravenous  antibiotics are administered.  Following induction with general endotracheal  anesthesia, a Foley catheter is placed.  The patient's chest, abdomen, both  groins, and both lower extremities are prepared and draped in a sterile  manner.   Baseline transesophageal echocardiogram is performed by Dr. Ivin Booty.  This  confirms the presence of severe aortic stenosis.  The aortic valve  appears  to be tricuspid, but all three leaflets are heavily calcified and virtually  immobile.  There is at least moderate aortic insufficiency, although the  left ventricular chamber is not dilated.  In fact, the left ventricular  chamber nearly obliterates with each systole, and the patient is noted to  have moderate-to-severe left ventricular hypertrophy.  There is no mitral  regurgitation.  No other abnormalities are noted.   A median sternotomy incision is performed.  The patient is heparinized  systemically.  The pericardium is opened.  The ascending aorta is mildly  sclerotic but otherwise normal in appearance.  There are no palpable plaques  or calcifications within the aorta, although there is hard plaque within the  proximal innominate artery.  The ascending aorta is cannulated for  cardiopulmonary bypass.  A two-stage  venous cannula is placed through the  tip of the right atrial appendage.  A retrograde cardioplegia catheter is  placed through the right atrium into the coronary sinus.  Adequate  heparinization is verified.   Cardiopulmonary bypass is begun.  A left ventricular vent is placed through  the right superior pulmonary vein.  A cardioplegia catheter is placed in the  ascending aorta.  A temperature probe is placed in the left ventricular  septum.   The patient is cooled to 30 degrees systemic temperature.  The aortic cross  clamp is applied and cardioplegia is delivered initially in an antegrade  fashion through the aortic root.  Iced saline slush is applied for topical  hypothermia,  Supplemental cardioplegia is administered retrograde through  the coronary sinus catheter.  The initial cardioplegic arrest and myocardial  cooling are felt to be excellent.  Repeat doses of cardioplegia are  administered intermittently through the cross-clamp portion of the operation  retrograde through the coronary sinus catheter to maintain septal  temperature below 15 degrees centigrade.   A transverse aortotomy incision is performed, and the aortic valve is  inspected.  There is severe calcific aortic stenosis.  The aortic valve is  tricuspid, but all three leaflets are severely thickened, calcified, and  immobile.  There is some dense calcification within the proximal portion of  the aortic root as well, particularly near the sinotubular junction  surrounding the left main coronary artery.  The left main coronary artery  and the right coronary artery are in the normal anatomical locations.  The  aortic valve is excised sharply.  The aortic annulus is decalcified.  This  is technically straightforward.  After decalcification and debridement, the  aortic root and the left ventricular chamber are irrigated with copious iced saline solution to rinse out any possible retained particular debris.  The  aortic  annulus is sized and appears to accept a 19-mm Edwards pericardial  valve.  A 21-mm valve will not pass through the sinotubular junction due to  the heavy calcification in this region and would not likely seat completely  in the aortic root.  Stentless aortic valve replacement would be feasible,  although potentially hazardous due to the heavy calcification within the  walls of the aorta in the region of the sinotubular junction and around the  left main coronary artery.  Based upon the patient's relatively small body  size and body surface area (1.7 meters squared), a 19-mm Magnum Series  Edwards pericardial valve is chosen for aortic valve replacement.   The aortic valve replacement is performed using interrupted 2-0 Ethibond  horizontal mattress pledgeted sutures with pledgets in the subannular  position.  A 19-mm Edwards bovine pericardial tissue valve (Model #[id number],  Serial [id number]) is secured in place uneventfully.  The valve seats normally  and without difficulty.  After completion of valve replacement, the left  main and the right coronary arteries are again reinspected to make sure that  they are well away from the sewing ring of the valve.  Rewarming is begun.  The aortotomy is closed with a standard two-layer closure of running 4-0  Prolene suture.  The left atrial appendage is amputated using an endoscopic  GIA stapling device with 2.5-mm vascular staple size.   The patient is placed in Trendelenburg position.  The lungs are ventilated,  and one final dose of warm retrograde Hotshot cardioplegia is administered.  The heart is allowed to fill while all air is evacuated through the aortic  root.  The aortic cross clamp is removed after a total cross-clamp time of  71 minutes.   The heart begins to beat spontaneously without need for cardioversion.  The  retrograde cardioplegia catheter is removed.  The left ventricular vent is  removed.  Epicardial pacing wires are affixed to  the right ventricle and to  the right atrial appendage.  The patient is rewarmed to 37 degrees  centigrade temperature.  The heart is again allowed to fill and the lungs  ventilated and all residual air was evacuated through the aortic root.  Normal sinus rhythm resumes spontaneously.   The patient is weaned from cardiopulmonary bypass without difficulty.  The  patient's rhythm at separation from bypass is normal sinus rhythm.  No  inotropic support is required.  Total cardiopulmonary bypass time for the  operation is 91 minutes.  Follow-up transesophageal echocardiogram performed  by Dr. Ivin Booty after separation from bypass demonstrates preserve left  ventricular function.  There is a well-seated aortic valve prosthesis.  There is no aortic insufficiency.  There is no mitral regurgitation.  There  is insignificant residual air.   The aortic root vent is removed.  The venous and arterial cannulae are both removed uneventfully.  Protamine is administered to reverse the  anticoagulation.  The mediastinum is irrigated with saline solution  containing vancomycin.  Meticulous surgical hemostasis is ascertained.   The mediastinum is drained using two chest tubes exited through separate  stab incisions inferiorly.  The median sternotomy is closed in routine  fashion.  The soft tissues anterior to the sternum are closed in multiple  layers, and the skin is closed with a running subcuticular skin closure.   The patient tolerated the procedure well and is transported to the surgical  intensive care unit in stable condition.  There are no intraoperative  complications.  All sponge, instrument, and needle counts are verified  correct at completion of the operation.  The patient was transfused one unit  of packed red blood cells during cardiopulmonary bypass due to anemia which  was present preoperatively and exacerbated by surgery.                                               Salvatore Decent. Cornelius Moras,  M.D.    CHO/MEDQ  D:  10/01/2003  T:  10/01/2003  Job:  [id number]   cc:   Jonelle Sidle, M.D. [facility]   Wende Crease, M.D.   [facility]

## 2010-06-26 NOTE — H&P (Signed)
[facility]  Patient:    [name], [name] Visit Number: [id number] MRN: [id number]          Service Type: Attending:  Javier Docker, M.D. Dictated by:   Marcie Bal Troncale, P.A.C. Adm. Date:  03/31/01                           History and Physical  DATE OF BIRTH:  1927-04-28  SOCIAL SECURITY #:  [id number]  CHIEF COMPLAINT:  Right-sided low back pain.  HISTORY OF PRESENT ILLNESS:  [name] is a 77 year old female who presents with continued lower back pain on the right side.  This has been going on for quite some time.  She has tried various conservative measures including various narcotic pain medications as well as epidural steroid injections without significant relief.  She had an MRI which demonstrated severe hypertrophic disease at L4-5.  Because of the lack of improvement with conservative measures and the interference with activities of daily living, she has now elected to undergo surgical intervention.  REVIEW OF SYSTEMS:  She denies any recent fever or chills.  No diplopia or blurred vision or headaches.  No rhinorrhea, sore throat, or earache.  She does report an occasional smokers cough in the morning, nonproductive.  No chest pain or shortness of breath.  No abdominal pain, nausea, vomiting, diarrhea, or constipation.  No melena or bright red blood per rectum.  She does have urinary frequency.  No dysuria or hematuria, however.  No numbness or tingling of her extremities.  PAST MEDICAL HISTORY: 1. Aortic stenosis. 2. Restless leg syndrome. 3. COPD. 4. Gastroesophageal reflux disease. 5. Anxiety disorder. 6. 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  PAST SURGICAL HISTORY: 1. Cholecystectomy. 2. Appendectomy. 3. Hysterectomy. 4. Recent negative cardiac catheterization.  FAMILY HISTORY:  Significant for coronary artery disease, cancer, and diabetes.  ALLERGIES:  No known drug allergies.  CURRENT  MEDICATIONS: 1. Advair 500/50 Diskus 1 puff b.i.d. 2. Clonazepam 1 mg q.d. 3. Celexa 20 mg q.d. 4. Prilosec 20 mg q.d. 5. Celebrex. 6. Vicodin. 7. Humibid L.A. 2 p.o. b.i.d.  SOCIAL HISTORY:  She is widowed.  Her daughter lives on the same property as the patient.  Also, she has some grandchildren who live with her.  She has three adult children.  Smokes a pack per day of cigarettes.  Denies alcohol use.  PRIMARY CARE PHYSICIAN:  Dr. Wende Crease, [place].  CARDIOLOGIST:  [facility].  PHYSICAL EXAMINATION:  VITAL SIGNS:  Afebrile, pulse 60, respiratory rate 20 and unlabored, blood pressure 130/84.  GENERAL:  Well-developed, well-nourished female in no acute distress.  Walks with an antalgic gait.  HEENT:  Head atraumatic, normocephalic.  Pupils are equal, round, and reactive to light.  Extraocular movements grossly intact.  She does have an upper denture.  Oropharynx clear, without redness, exudate, or lesions.  NECK:  Supple.  No cervical lymphadenopathy.  CHEST:  Some right more than left bibasilar crackles.  Otherwise, clear to auscultation bilaterally.  HEART:  With a 2-3/6 systolic murmur best heard over the aortic region. Regular rate and rhythm.  ABDOMEN:  Soft and minimally tender in the epigastric region.  Nondistended. Active bowel sounds.  BREASTS, GENITOURINARY:  Not examined, not pertinent to present illness.  EXTREMITIES:  With 1+ DP pulses.  Decreased quadriceps strength on the right. Decreased right knee reflex.  She is tender in the right posterosuperior iliac spine.  EHL is  5-/5 on the right.  Right straight leg raising causes buttock and posterior thigh pain.  IMPRESSION: 1. Spinal stenosis and herniated nucleus pulposus L4-5 on the right. 2. Aortic stenosis. 3. Restless leg syndrome. 4. Chronic obstructive pulmonary disease. 5. Gastroesophageal reflux disease. 6. Anxiety disorder. 7. Left eye retinal detachment.  PLAN:  The patient  will be admitted to [facility] to undergo lateral recess decompression L4-5 on the right with microdiskectomy by Dr. Shelle Iron on March 31, 2001.  Dr. Doyne Keel has forwarded [place] notes from her preoperative evaluation.  The patient actually saw Dr. Lewayne Bunting for a cardiac evaluation. She had a 2-D echo of the heart which showed good left ventricular function and no dilatation of the left ventricle.  There was definite aortic stenosis, but this was called severe but not critical, and this was read by Dr. Myrtis Ser.  A myocardial SPECT scan showed some flushing and chest tightness with adenosine. EKGs were equivocal with resting nonspecific ST changes.  Nuclear images normal, and there was normal wall motion and normal ejection fraction.  There was no evidence of ischemia.  A carotid duplex Doppler was obtained and showed negative color flow with no rate-limiting stenosis.  Preoperative laboratories and signed surgical consents will be obtained.  All her questions have been encouraged and answered. Dictated by:   Marcie Bal Troncale, P.A.C. Attending:  Javier Docker, M.D. DD:  03/23/01 TD:  03/23/01 Job: [id number]

## 2010-06-26 NOTE — Assessment & Plan Note (Signed)
[facility]                            [facility] OFFICE NOTE   NAME:[name], [name]                    MRN:          [id number]  DATE:08/31/2005                            DOB:          [birth year]    PRIMARY CARE PHYSICIAN:  Dr. Wende Crease.   REASON FOR VISIT:  Routine followup.   HISTORY OF PRESENT ILLNESS:  [name] presents for a routine visit with a  history of non-obstructive coronary artery disease, and history of  bioprosthetic aortic valve with previously documented mild perivalvular  leak.  Her history is also complicated by chronic obstructive pulmonary  disease with history of intermittent tobacco use and home oxygen.  She is  not having any anginal chest pain.  She does state that she is due to see  Dr. Doyne Keel and has had some trouble with chest congestion recently and a  cough.  She has not had any fevers, and recently had a head cold.  She  reports compliance with her medications in general, tells me that she has  not smoked over the last month.  I encouraged her in this regard.   ALLERGIES:  MORPHINE.   CURRENT MEDICATIONS:  1.  Gabapentin 300 mg p.o. q.h.s.  2.  Lasix 40 mg one to two tablets p.o. daily.  3.  Tegretol 20 mg p.o. daily.  4.  Oxycodone p.r.n.  5.  Zocor 40 mg p.o. q.h.s.  6.  Metoprolol 25 mg p.o. daily.  7.  Imdur 30 mg p.o. daily.  8.  Zetia 10 mg p.o. daily.  9.  Citalopram 40 mg p.o. daily.  10. Aspirin 81 mg p.o. daily.  11. Advair 250/50 b.i.d.  12. DuoNeb.  13. Mucinex.   REVIEW OF SYSTEMS:  As in history of present illness.   PHYSICAL EXAMINATION:  VITAL SIGNS:  Blood pressure today is 102/60, heart  rate is 76, weight is 151 pounds.  NECK:  No elevated jugular venous pressure or loud bruits.  LUNGS:  There are coarse rhonchorous breath sounds, but no wheezing, and  there is no labored breathing at rest.  CARDIOVASCULAR:  A 2/6 systolic murmur heard at the base without S3,  gallop.  EXTREMITIES:  No pitting edema.   IMPRESSION:  1.  Non-obstructive coronary atherosclerosis with previously documented      normal ejection fraction.  She is not reporting any angina.  I      encouraged efforts at continued smoking cessation and general risk      factor modification which has been followed by Dr. Doyne Keel over time.      We will plan a yearly followup for her.  2.  History of bioprosthetic aortic valve with mild perivalvular leak.  I      will plan a followup echocardiogram to ensure that this is stable.  3.  From the perspective of her chronic obstructive pulmonary disease, she      does have a follow up visit with Dr. Doyne Keel.  She has seen our      pulmonary division in the past and certainly they could be re-consulted  if there are any new or progressive problems.                                   Jonelle Sidle, MD   SGM/MedQ  DD:  08/31/2005  DT:  08/31/2005  Job #:  [id number]   cc:   Wende Crease, M.D.

## 2010-06-26 NOTE — Op Note (Signed)
NAME:  [name], [name]                       ACCOUNT NO.:  [id number]   MEDICAL RECORD NO.:  [id number]                   PATIENT TYPE:  INP   LOCATION:  2305                                 FACILITY:  [facility]   PHYSICIAN:  Sheldon Silvan, M.D.                   DATE OF BIRTH:  03/25/1927   DATE OF PROCEDURE:  10/01/2003  DATE OF DISCHARGE:                                 OPERATIVE REPORT   PROCEDURE PERFORMED:  Intraoperative transesophageal echocardiography (TEE).   INDICATIONS FOR PROCEDURE:  [name] was brought to the operating room  today by Salvatore Decent. Cornelius Moras, M.D. for replacement of her aortic valve for  severe aortic stenosis.   DESCRIPTION OF PROCEDURE:  The patient had no known difficulty swallowing  and after successful induction of general anesthesia, the oropharynx was  examined and found to be without obstruction.  Endotracheal intubation was  performed.  It was decided that TEE would be appropriate for her for both  diagnostic and monitoring purposes for this operation.  After appropriate  lubrication, the [employer] OmniPlane TEE probe was passed through the  oropharynx into the esophagus on one pass.  The heart was imaged starting  with the left ventricle.  The left ventricle was very thickened, but  contracted well in all four segments.  The papillary muscles at the  midventricle showed themselves to be intact.  The chordae were intact on  examination further cranially.   The mitral valve was examined and apposition of the leaflets was  appropriate.  There was no prolapse noted.  On Color Flow exam there was  only trace regurgitation.  The left atrial appendage was imaged with some  difficulty and there were no obvious thrombi or smoke.   The aortic valve was examined and found to be quite calcified on all three  cusps.  Cusp movement was minimal.  The long axis view showed annulus  measurement of about 2.9 cm.  On Color Flow exam, there was obvious  stenosis  with very little regurgitation.   The interatrial septum was examined and using Color Flow techniques, there  was no PFO noted.  The right ventricle appeared normal as did the tricuspid  valve.   The patient was placed on bypass by Dr. Cornelius Moras and the aortic valve was  replaced with a Bovine valve that was a tissue valve.  After filling the  heart just off bypass, the aortic valve was examined and the ring was seen  to be seated well.  The Color Flow exam in the long axis showed no  regurgitation.  There was mild stenosis in the trace to 1+ range.  The rest  of the cardiac exam was unchanged.  Specifically, the left ventricle  remained excellent in its contractility and the TEE image was used for  monitoring and filling of the left ventricle.  After completion of weaning  from bypass, the chest was  closed.  The patient tolerated this well and the  probe was removed prior to transport to the SICU.                                               Sheldon Silvan, M.D.    DC/MEDQ  D:  10/01/2003  T:  10/02/2003  Job:  [id number]

## 2010-06-26 NOTE — Discharge Summary (Signed)
[facility]  Patient:    [name], [name] Visit Number: [id number] MRN: [id number]          Service Type: SUR Location: 4W [id number] Attending Physician:  Pierce Crane Dictated by:   Alexzandrew L. Perkins, P.A.-C. Admit Date:  03/31/2001 Discharge Date: 04/03/2001                             Discharge Summary  ADMITTING DIAGNOSES: 1. Spinal stenosis and herniated nucleus pulposus, L4-5 on the right. 2. Aortic stenosis. 3. Restless legs syndrome. 4. Chronic obstructive pulmonary disease. 5. Gastroesophageal reflux disease. 6. Anxiety disorder. 7. Left eye retinal detachment.  DISCHARGE DIAGNOSES: 1. Spinal stenosis and lateral recess stenosis at L4-5, status post central    lumbar decompression and bilateral hemilaminotomy at L4-5 with foraminotomy    in the L4 segment. 2. Aortic stenosis. 3. Restless legs syndrome. 4. Chronic obstructive pulmonary disease. 5. Gastroesophageal reflux disease. 6. Anxiety disorder. 7. Left eye retinal detachment. 8. Mild postoperative anemia.  PROCEDURE:  The patient was taken to the operating room on March 31, 2001, and underwent a central lumbar decompression with bilateral hemilaminotomy and foraminotomy at L4 with lateral recess decompression at L4-5.  Surgeon: Javier Docker, M.D.  Assistant:  Alexzandrew L. Perkins, P.A.-C.  Surgery done under general anesthesia.  CONSULTATIONS:  None.  HISTORY OF PRESENT ILLNESS:  The patient is a 77 year old female who presented and had been evaluated for continued low back pain and right-sided pain.  This had been ongoing for some time now.  She had been treated conservatively in the past utilizing various narcotic pain medications and as well as epidural steroid injections.  She was sent for an MRI, which did show hypertrophic disease at L4-5.  Due to the lack of improvement, it was felt that she would benefit from undergoing surgical interventions,  risks and benefits discussed, and she elected to proceed with surgery.  LABORATORY DATA:  H&H on admission showed a hemoglobin of 14.6, hematocrit of 42.8.  Postop H&H 12.6, hematocrit of 36.9.  Last H&H was 11.1 with 32.3 hematocrit noted.  Renal panel on admission on March 21, 2001, showed BUN and creatinine within normal limits and only minimally elevated glucose of 123.  Follow-up BMET postoperative:  The glucose came back within normal limits, drop in BUN from 8 to 4, remaining BMET all within normal limits. Last noted BMET on April 02, 2001, shows drop in sodium down to 127, remaining BMET all within normal limits with the exception of a low calcium of 7.9.  Urinalysis on admission negative with the exception of small leukocyte esterase, hyaline casts, and rare bacteria, and only 0-2 white cells.  Two views of the chest taken on March 21, 2001, showed a plate-like atelectasis or scarring in the posterior left lower lobe.  _____ projecting in the midthoracic spine and lateral radiograph with discussion, recommend comparison with any films for further evaluation.  If these are unavailable, a thoracic spine series may be helpful to confirm that this is related to thoracic spurring.  Thoracic spine films:  Mild mid- to lower thoracic spondylosis.  Intraoperative lumbar films taken on April 02, 2001, show a needle overlying the process of L5-S1.  Film 2 reveals process over L3 and L4. Film 3 reveals process over L4 and L5.  Left shoulder films on April 01, 2001, show no evidence of fracture or dislocation.  HOSPITAL COURSE:  The patient  was admitted to [facility], taken to the OR, where she underwent the above-stated procedure without complication. The patient tolerated the procedure well and then was later transferred to the recovery room and then to the orthopedic floor for continued postop care. Vital signs were followed.  The patient was placed on low-dose  PCA analgesic for pain control following the surgery, was also given p.o. medications. Vioxx was started postop.  The patient did have some shoulder pain.  X-rays were taken.  It was felt to be positional.  X-rays were negative.  H&Hs were followed very closely.  Had a mild drop in hemoglobin, however remained hemodynamically stable.  Continued to progress through the hospital course. She did undergo a left shoulder subacromial injection per Dr. Rennis Chris, who was covering for Dr. Shelle Iron over the weekend.  This did help and assist with positional pain, which was felt to be impingement syndrome that had been inflamed from positioning on the table.  She continued to progress well and by postop day 3, the patient was doing quite well, had been up ambulating with physical therapy, and it was decided the patient would be discharged home. The patient did undergo OT evaluation prior to discharge.  DISCHARGE PLAN:  The patient was discharged home on April 03, 2001.  DISCHARGE DIAGNOSES:  Please see above.  DISCHARGE MEDICATIONS: 1. Percocet p.r.n. pain. 2. Robaxin p.r.n. spasm. 3. Colace. 4. Over-the-counter vitamin C.  DISCHARGE INSTRUCTIONS:  Diet as tolerated.  Follow-up:  Follow up with Dr. Shelle Iron two weeks from date of surgery.  She will follow up with Renae Fickle, the therapist at [facility], for some rehab this week.  Call for both appointments at [phone].  Activity:  Back precautions.  Walking for exercise. Shower five days from surgery.  CONDITION UPON DISCHARGE:  Improved. Dictated by:   Alexzandrew L. Perkins, P.A.-C. Attending Physician:  Pierce Crane DD:  04/25/01 TD:  04/26/01 Job: [id number]

## 2010-06-26 NOTE — Op Note (Signed)
[facility]  Patient:    [name], [name] Visit Number: [id number] MRN: [id number]          Service Type: Attending:  Javier Docker, M.D. Dictated by:   Javier Docker, M.D. Proc. Date: 03/31/01                             Operative Report  PREOPERATIVE DIAGNOSIS: 1. Spinal stenosis. 2. Lateral recess stenosis L4-5.  POSTOPERATIVE DIAGNOSIS: 1. Spinal stenosis. 2. Lateral recess stenosis L4-5.  PROCEDURE PERFORMED: 1. Central lumbar decompression. 2. Bilateral hemilaminotomy. 3. Lateral recess decompression. 4. Foraminotomy L4.  ANESTHESIA:  General.  SURGEON:  Javier Docker, M.D.  ASSISTANT:  Alexzandrew L. Perkins, P.A.-C.  BRIEF HISTORY AND INDICATION:  A 77 year old with refractory L5 radicular pain.  Despite conservative treatment including epidural steroid injections, rest, and anti-inflammatory medications, she was narcotic-dependent.  Due to the persistence of her symptoms and the evidence of a myelogram and MRI indicating severe foraminal stenosis, particularly on the right with an L4 radiculopathy, operative intervention was indicated for decompression.  The patient was reporting the onset of left lower extremity symptoms, not as severe as the right.  Due to the facet hypertrophy and the limited interlaminar space, it was felt that a central decompression would provide safer decompression on the right at L4-5 and allow visualization of the left. Risks and benefits discussed including bleeding, infection, damage to vascular structures, CSF leakage, epidural fibrosis, need for fusion in case of segment disease, etc.  TECHNIQUE:  Patient in supine position.  After an adequate level of general anesthesia, 1 g Kefzol, she was placed prone on rolls.  The lumbar region prepped and draped in the usual sterile fashion.  Two 18 gauge spinal needles were utilized to localize the L4-5 interspace, confirmed with x-ray.  Incision was  made from the spinous process of L3 to L5, subcutaneous tissue was dissected.  Electrocautery was utilized to achieve hemostasis.  The dorsolumbar fascia identified and divided in line with the skin incision. Paraspinous muscles elevated from the lamina of L4 and L5.  Two confirmatory radiographs were obtained to confirm the L4-5 interspace.  McCullough retractor was placed.  We confirmed a small interlaminar window.  Leksell was utilized to perform the central decompression of L4-L5, removing the spinous processes of L4 and partially of L5.  There was soft bone noted.  A 2 and a 3 mm Kerrison was utilized to perform partial medial hemifacetectomy and decompression the lateral recess before essentially decompressing.  In a piecemeal fashion, a 2 mm Kerrison was utilized to decompress both cephalad and caudad through the attachment of the ligamentum flavum.  Significant lateral recess stenosis was noted actually on the left and particularly on the right with severe compression of the L4 root and the L5 in the lateral recess secondary to facet hypertrophy and facet arthropathy and ligamentum flavum hypertrophy.  Due to this, o operating microscope was draped and brought into the surgical field with minimal retraction upon the thecal sac, not retracting past the midline.  An L4 foraminotomy was performed with the 2 and the 3 mm Kerrison, protecting the L4 and L5 roots at all times.  The ligamentum flavum and the bony spurs were removed which decompressed the L4 and L5 roots. Hockey-stick probe placed in the foramen of L4 and L5 and found to be widely patent following this.  Bipolar electrocautery was utilized to achieve strict hemostasis.  The  disk was identified and found to be a hardened disk without rupture.  In a similar fashion, the ligamentum flavum was decompressed on the left side.  Following this, there was adequate room in the L4 and L5 foramen. Bipolar electrocautery was utilized to  achieve hemostasis here as well.  The thecal sac was mobile without evidence of CSF leakage.  Thrombin-soaked Gelfoam was placed in the laminotomy defects after copious irrigation.  Bone wax was placed on the cancellous surfaces.  The McCullough retractor was removed.  Paraspinous muscles inspected with no evidence of active bleeding. A Hemovac was placed and brought out through a separate stab wound in the skin.  The dorsolumbar fascia was meticulously closed watertight with #1 Vicryl interrupted figure-of-eight sutures.  Subcutaneous tissue reapproximated with 0 and 2-0 Vicryl simple sutures.  Skin was reapproximated with staples.  Wound was dressed sterilely.  The patient was then placed supine on the hospital bed, extubated without difficulty, and transported to the recovery room in satisfactory condition.  The patient tolerated the procedure well without complications.  Blood loss was 200 cc. Dictated by:   Javier Docker, M.D. Attending:  Javier Docker, M.D. DD:  03/31/01 TD:  03/31/01 Job: [id number]

## 2010-06-26 NOTE — Cardiovascular Report (Signed)
[name], [name]             ACCOUNT NO.:  [id number]   MEDICAL RECORD NO.:  [id number]          PATIENT TYPE:  INP   LOCATION:  3707                         FACILITY:  [facility]   PHYSICIAN:  Arturo Morton. Riley Kill, M.D. [facility] BIRTH:  1927/09/08   DATE OF PROCEDURE:  DATE OF DISCHARGE:                              CARDIAC CATHETERIZATION   INDICATIONS:  Ms. Brinegar is a 77 year old who has undergone aortic valve  replacement with prosthetic valve. She did not have critical coronary artery  disease. She has presented with respiratory illness, and has some positive  enzymes. She has subsequently referred down for repeat catheterization.   PROCEDURE:  1.  Selective coronary arteriography.  2.  Aortic root aortography.   DESCRIPTION OF PROCEDURE:  Procedure is performed from the right femoral  artery. Views of left and right coronary arteries were obtained in multiple  angiographic projections.  Proximal root aortography was also performed  because of some evidence of mild perivalvular leak. She tolerated procedure  without complication.   HEMODYNAMIC DATA.:  Central aortic pressure 171/90, mean 121.   ANGIOGRAPHIC DATA.:  1.  Aortic root aortography revealed a nondilated proximal aortic root.      There was a mild perivalvular leak.  2.  There is calcification at the ostium of the left main.  3.  There is no significant obstruction of the left main coronary artery.  4.  The left anterior descending artery demonstrates some segmental 30%      plaquing in the mid vessel after the diagonal takeoff.  The diagonal      itself has some eccentric 40% narrowing noted best in the LAO views. No      other high-grade areas of stenosis were noted. The distal LAD wrapped      the apex and provided a portion of the inferior wall.  5.  The circumflex provided a posterolateral branch and there is segmental      23% narrowing in a mid portion of the circumflex. Again, this did not      appear  to be highly obstructive.  6.  The right coronary artery is a dominant vessel. There is evidence of a      focal stenosis in the proximal vessel. This proximal stenosis measures      around 40% luminal reduction but does not appear to be flow-limiting and      has an MLD in excess of 2 mm easily. The distal vessel consists of a      bifurcating posterior descending branch and a large posterolateral      branch. There is a second smaller posterolateral branch.  All of these      appear free of critical disease.   CONCLUSION:  1.  No high-grade critical stenoses of the coronary vessels at this time.  2.  Mild perivalvular leak.   I have called Dr. Diona Browner. We will treat her for her COPD. I reviewed the  films with the children. At the present time, she does not require  percutaneous intervention or bypass surgery. Continued medical therapy is  warranted. Unfortunately, the patient  has started smoking again. It will be  absolutely imperative that she stop for good long-term outcome.      TDS/MEDQ  D:  06/10/2004  T:  06/10/2004  Job:  [id number]   cc:   Jonelle Sidle, M.D. [facility]   Elby Beck, M.D.   [facility]   Salvatore Decent. Cornelius Moras, M.D.  [address]  [place]  [place] [address]

## 2010-09-09 DIAGNOSIS — G459 Transient cerebral ischemic attack, unspecified: Secondary | ICD-10-CM

## 2010-09-09 HISTORY — DX: Transient cerebral ischemic attack, unspecified: G45.9

## 2010-11-06 DIAGNOSIS — I509 Heart failure, unspecified: Secondary | ICD-10-CM

## 2010-11-06 DIAGNOSIS — I4891 Unspecified atrial fibrillation: Secondary | ICD-10-CM

## 2010-11-06 DIAGNOSIS — I5031 Acute diastolic (congestive) heart failure: Secondary | ICD-10-CM

## 2010-11-08 DIAGNOSIS — I4891 Unspecified atrial fibrillation: Secondary | ICD-10-CM

## 2010-11-08 DIAGNOSIS — I5031 Acute diastolic (congestive) heart failure: Secondary | ICD-10-CM

## 2010-11-09 DIAGNOSIS — I4891 Unspecified atrial fibrillation: Secondary | ICD-10-CM

## 2010-11-10 ENCOUNTER — Inpatient Hospital Stay (HOSPITAL_COMMUNITY)
Admission: EM | Admit: 2010-11-10 | Discharge: 2010-11-11 | DRG: 280 | Disposition: A | Discharge status: Home / Self Care | Payer: Medicare Other | Source: Other Acute Inpatient Hospital | Attending: Cardiology | Admitting: Cardiology

## 2010-11-10 DIAGNOSIS — Z952 Presence of prosthetic heart valve: Secondary | ICD-10-CM

## 2010-11-10 DIAGNOSIS — I214 Non-ST elevation (NSTEMI) myocardial infarction: Principal | ICD-10-CM | POA: Diagnosis present

## 2010-11-10 DIAGNOSIS — E785 Hyperlipidemia, unspecified: Secondary | ICD-10-CM | POA: Diagnosis present

## 2010-11-10 DIAGNOSIS — I10 Essential (primary) hypertension: Secondary | ICD-10-CM | POA: Diagnosis present

## 2010-11-10 DIAGNOSIS — I5031 Acute diastolic (congestive) heart failure: Secondary | ICD-10-CM

## 2010-11-10 DIAGNOSIS — I6529 Occlusion and stenosis of unspecified carotid artery: Secondary | ICD-10-CM | POA: Diagnosis present

## 2010-11-10 DIAGNOSIS — I2510 Atherosclerotic heart disease of native coronary artery without angina pectoris: Secondary | ICD-10-CM

## 2010-11-10 DIAGNOSIS — I4891 Unspecified atrial fibrillation: Secondary | ICD-10-CM | POA: Diagnosis present

## 2010-11-10 DIAGNOSIS — H548 Legal blindness, as defined in USA: Secondary | ICD-10-CM | POA: Diagnosis present

## 2010-11-10 DIAGNOSIS — G2581 Restless legs syndrome: Secondary | ICD-10-CM | POA: Diagnosis present

## 2010-11-10 DIAGNOSIS — D509 Iron deficiency anemia, unspecified: Secondary | ICD-10-CM | POA: Diagnosis present

## 2010-11-10 DIAGNOSIS — I509 Heart failure, unspecified: Secondary | ICD-10-CM | POA: Diagnosis present

## 2010-11-10 DIAGNOSIS — J189 Pneumonia, unspecified organism: Secondary | ICD-10-CM | POA: Diagnosis present

## 2010-11-10 DIAGNOSIS — J441 Chronic obstructive pulmonary disease with (acute) exacerbation: Secondary | ICD-10-CM | POA: Diagnosis present

## 2010-11-10 DIAGNOSIS — Z8673 Personal history of transient ischemic attack (TIA), and cerebral infarction without residual deficits: Secondary | ICD-10-CM

## 2010-11-10 DIAGNOSIS — Z7982 Long term (current) use of aspirin: Secondary | ICD-10-CM

## 2010-11-10 DIAGNOSIS — Z79899 Other long term (current) drug therapy: Secondary | ICD-10-CM

## 2010-11-10 LAB — CBC
HCT: 36.1 % (ref 36.0–46.0)
MCHC: 33.5 g/dL (ref 30.0–36.0)
MCV: 87.2 fL (ref 78.0–100.0)
WBC: 8.1 10*3/uL (ref 4.0–10.5)

## 2010-11-10 LAB — BASIC METABOLIC PANEL
CO2: 34 mEq/L — ABNORMAL HIGH (ref 19–32)
Chloride: 97 mEq/L (ref 96–112)
Creatinine, Ser: 0.81 mg/dL (ref 0.50–1.10)
GFR calc non Af Amer: 66 mL/min — ABNORMAL LOW (ref >90)
Potassium: 4 mEq/L (ref 3.5–5.1)
Sodium: 139 mEq/L (ref 135–145)

## 2010-11-11 DIAGNOSIS — I4891 Unspecified atrial fibrillation: Secondary | ICD-10-CM

## 2010-11-11 LAB — BASIC METABOLIC PANEL
BUN: 29 mg/dL — ABNORMAL HIGH (ref 6–23)
CO2: 33 mEq/L — ABNORMAL HIGH (ref 19–32)
Chloride: 99 mEq/L (ref 96–112)
GFR calc non Af Amer: 60 mL/min — ABNORMAL LOW (ref >90)
Glucose, Bld: 130 mg/dL — ABNORMAL HIGH (ref 70–99)
Potassium: 4.4 mEq/L (ref 3.5–5.1)
Sodium: 137 mEq/L (ref 135–145)

## 2010-11-11 NOTE — Cardiovascular Report (Signed)
  NAMEHAIZEL, [name] NO.:  [id number]  MEDICAL RECORD NO.:  [id number]  LOCATION:  3707                         FACILITY:  [facility]  PHYSICIAN:  Veverly Fells. Excell Seltzer, MD  DATE OF BIRTH:  Sep 28, 1927  DATE OF PROCEDURE:  11/10/2010 DATE OF DISCHARGE:                           CARDIAC CATHETERIZATION   PROCEDURES: 1. Catheter placement for selective coronary angiography. 2. Selective coronary angiography.  PROCEDURAL INDICATIONS:  [name] is an 77 year old woman who presented with atrial fib with RVR.  She has had prior aortic valve replacement using a bioprosthetic valve.  She had positive cardiac enzymes and underwent a Myoview stress scan.  She did not have coronary disease noted at the time of her aortic valve replacement.  However, her Myoview stress scan showed a moderate area of lateral wall ischemia and she was referred for cardiac cath.  Risks and indications of the procedure were reviewed with the patient. Informed consent was obtained.  The right groin was prepped, draped, and anesthetized with 1% lidocaine using modified Seldinger technique.  A 5- French sheath was placed in the right femoral artery.  Standard Judkins catheters were used for selective coronary angiography.  The patient tolerated the procedure well.  There were no immediate complications.  PROCEDURAL FINDINGS:  Aortic pressure is 112/59 with a mean of 82.  Left mainstem:  The left main is widely patent.  There is no obstructive disease.  It divides into the LAD and left circumflex.  LAD:  The LAD is also widely patent.  The vessel reaches the left ventricular apex.  There is mild proximal LAD calcification.  There is a large first diagonal branch with no significant stenosis.  Just beyond the diagonal branch, the LAD has 20% stenosis.  There is no significant disease throughout the course of the entire vessel.  Left circumflex:  The left circumflex has no significant  obstructive disease.  There is very mild ostial plaque.  The vessel gives off a large first OM branch with no significant stenosis.  RCA:  The RCA is dominant.  The vessel has 20%-30% proximal stenosis and gives off a moderate caliber PDA branch and a large posterolateral branch.  There are no significant stenoses visualized throughout the course of the RCA or its branch vessels.  Left ventriculography was not performed.  The patient has normal LV function by her nuclear scan.  However, the patient has a persistent thebesian network in the LV is visualized.  During coronary angiography, the patient has vigorous left ventricular systolic function.  FINAL CONCLUSION:  Minor, nonobstructive coronary artery disease as described above.  RECOMMENDATIONS:  The patient will start Coumadin tonight for paroxysmal atrial fibrillation.  I would not recommend heparin bridging.  I am anticipating discharge home tomorrow.  She will follow up with Dr. Kirke Corin in the [facility] and will also be established in the [facility].     Veverly Fells. Excell Seltzer, MD     MDC/MEDQ  D:  11/10/2010  T:  11/11/2010  Job:  [id number]  cc:   Lorine Bears, MD  Electronically Signed by Tonny Bollman MD on 11/11/2010 10:36:01 PM

## 2010-11-13 ENCOUNTER — Encounter: Payer: Medicare Other | Admitting: *Deleted

## 2010-11-13 ENCOUNTER — Encounter: Payer: Self-pay | Admitting: *Deleted

## 2010-11-22 NOTE — Discharge Summary (Addendum)
NAMEJOCEE, [name]             ACCOUNT NO.:  [id number]  MEDICAL RECORD NO.:  [id number]  LOCATION:  3707                         FACILITY:  [facility]  PHYSICIAN:  Lorine Bears, MD     DATE OF BIRTH:  1927-05-05  DATE OF ADMISSION:  11/10/2010 DATE OF DISCHARGE:  11/11/2010                              DISCHARGE SUMMARY   PRIMARY DISCHARGE DIAGNOSES: 1. Newly diagnosed atrial fibrillation, initiated on Coumadin. 2. Acute diastolic heart failure, felt secondary to atrial     fibrillation with rapid ventricular response.     a.     Normal left ventricle function with ejection fraction of 60-      65% by echo at [facility], showing moderate left      ventricular hypertrophy, impaired laxation, mild-to-moderate      mitral stenosis, pericardial aortic valve with a mean gradient 29      mmHg. 3. Type 2 non-ST elevation myocardial infarction with abnormal nuclear     study at [facility] on November 09, 2010, and cardiac catheterization     on November 10, 2010, demonstrating mild nonobstructive coronary     artery disease.  SECONDARY DISCHARGE DIAGNOSES: 1. History of aortic stenosis, status post bioprosthetic aortic valve     replacement in 2005. 2. Moderate carotid artery stenosis. 3. Hyperlipidemia. 4. Iron-deficiency anemia. 5. Legally blind. 6. Transient ischemic attack in August 2012. 7. Restless legs syndrome. 8. Hypertension.  HOSPITAL COURSE:  [name] is an 77 year old lady with a history of nonobstructive CAD, aortic stenosis, and TIA, who presented to [facility] with complaints of increased dyspnea with cough and chest pain mostly triggered by the cough.  She also complained of feeling fluttering in her heart and upon arrival to the ER at [facility], she was found to be in AFib with RVR with heart rate of 180 beats per minute.  She was given IV Cardizem and converted to normal sinus rhythm. Chest x-ray showed pulmonary edema.  Cardiac enzymes  were cycled which did turn positive to peak of 0.24 and went back down to 0.18.  This was felt consistent with the type 2 MI eventually once her cardiac workup was completed here in the hospital.  She also was felt to have acute congestive heart failure likely due to AFib with RVR.  There is also mention of COPD exacerbation and possible pneumonia for which she received 5 days of Levaquin.  She was started on low-molecular-weight heparin and 2-D echocardiogram was recommended with the above findings. She subsequently underwent nuclear stress testing revealing a medium partially reversible mid basal lateral defect with normal wall motion consistent with ischemia in addition to prior MI.  She was transferred to [facility] for further evaluation and underwent catheterization yesterday, demonstrating minor nonobstructive CAD with normal LV function.  During this time, she had also been in the process of being initiated on Coumadin, which will be continued at discharge.  Dr. Kirke Corin feels that she has received sufficient dosing of Levaquin.  She has also been maintained on diltiazem 120 mg extended release formulation with improved heart rate and is maintaining sinus rhythm.  Dr. Kirke Corin is seen and examined her today and felt  she is stable for discharge.  DISCHARGE LABS:  WBC 8.1, hemoglobin 12.1, hematocrit 36.1, platelets count 256.  INR was 1.56.  Sodium 137, potassium 4.4, chloride 99, CO2 of 33, glucose 130, BUN 29, creatinine 0.88.  STUDIES: 1. Chest x-ray at [facility], please see report for further     details. 2. Cardiac catheterization, November 10, 2010, please see full report     for details as well as HPI for summary. 3. A 2-D echocardiogram and nuclear results can also be summarized as     above.  DISCHARGE MEDICATIONS: 1. Coumadin 5 mg.  I will request with the pharmacist at [facility] to     recommend 1 tablet tonight, 1 tablet following night, and heparin     and  INR check on Friday to determine what her next course should     be. 2. Diltiazem 120 mg daily. 3. Simvastatin 40 mg half tablet daily q.h.s.  This is a new lower     dose given the concurrent diltiazem use. 4. Advair Diskus 250/50 one puff inhaled b.i.d. 5. Atrovent one nebulization inhaled q.i.d. 6. Citalopram 20 mg daily. 7. Gabapentin 300 mg t.i.d. 8. Imdur 30 mg daily. 9. Lasix 40 mg half tablet daily. 10.Omeprazole 20 mg daily. 11.Ropinirole 0.5 mg t.i.d. 12.Vicodin 5/500 mg q.4 h. p.r.n. pain.  Dr. Kirke Corin has stopped her Aspirin in this admission with the initiation of Coumadin.  DISPOSITION:  [name] will be discharged in stable condition to home.  She is to increase her activity slowly and is not to lift anything over 5 pounds for 1 week, drive for 2 days or participate in strenuous activity for 1 week.  She is to follow a low-sodium heart- healthy diet and call or return for pain, swelling, bleeding or pus at her cath site.  She will follow up with Dr. Diona Browner in the [facility] office on December 18, 2010, at 10 a.m.  She will have her first INR checked on Friday on October 5 at 10:05 a.m.  DURATION OF DISCHARGE ENCOUNTER:  Greater than 30 minutes including physician and PA time.     Ronie Spies, P.A.C.   ______________________________ Lorine Bears, MD    DD/MEDQ  D:  11/11/2010  T:  11/11/2010  Job:  [id number]  cc:   Jonelle Sidle, MD Doreen Beam, MD  Electronically Signed by Ronie Spies  on 11/22/2010 03:09:49 PM Electronically Signed by Lorine Bears MD on 11/24/2010 12:33:07 PM

## 2010-12-15 ENCOUNTER — Encounter: Payer: Self-pay | Admitting: *Deleted

## 2010-12-17 ENCOUNTER — Encounter: Payer: Self-pay | Admitting: Cardiology

## 2010-12-18 ENCOUNTER — Encounter: Payer: Medicare Other | Admitting: Cardiology

## 2010-12-25 ENCOUNTER — Ambulatory Visit (INDEPENDENT_AMBULATORY_CARE_PROVIDER_SITE_OTHER): Payer: Medicare Other | Admitting: Cardiology

## 2010-12-25 ENCOUNTER — Encounter: Payer: Self-pay | Admitting: Cardiology

## 2010-12-25 ENCOUNTER — Ambulatory Visit (HOSPITAL_COMMUNITY)
Admission: RE | Admit: 2010-12-25 | Discharge: 2010-12-25 | Disposition: A | Discharge status: Home / Self Care | Payer: Medicare Other | Source: Ambulatory Visit | Attending: Cardiology | Admitting: Cardiology

## 2010-12-25 DIAGNOSIS — Z87891 Personal history of nicotine dependence: Secondary | ICD-10-CM | POA: Insufficient documentation

## 2010-12-25 DIAGNOSIS — R0602 Shortness of breath: Secondary | ICD-10-CM

## 2010-12-25 DIAGNOSIS — R918 Other nonspecific abnormal finding of lung field: Secondary | ICD-10-CM | POA: Insufficient documentation

## 2010-12-25 DIAGNOSIS — J449 Chronic obstructive pulmonary disease, unspecified: Secondary | ICD-10-CM | POA: Insufficient documentation

## 2010-12-25 DIAGNOSIS — R059 Cough, unspecified: Secondary | ICD-10-CM | POA: Insufficient documentation

## 2010-12-25 DIAGNOSIS — R05 Cough: Secondary | ICD-10-CM | POA: Insufficient documentation

## 2010-12-25 DIAGNOSIS — N289 Disorder of kidney and ureter, unspecified: Secondary | ICD-10-CM

## 2010-12-25 DIAGNOSIS — I25811 Atherosclerosis of native coronary artery of transplanted heart without angina pectoris: Secondary | ICD-10-CM

## 2010-12-25 DIAGNOSIS — J4489 Other specified chronic obstructive pulmonary disease: Secondary | ICD-10-CM

## 2010-12-25 DIAGNOSIS — I2510 Atherosclerotic heart disease of native coronary artery without angina pectoris: Secondary | ICD-10-CM

## 2010-12-25 DIAGNOSIS — I480 Paroxysmal atrial fibrillation: Secondary | ICD-10-CM | POA: Insufficient documentation

## 2010-12-25 DIAGNOSIS — I4891 Unspecified atrial fibrillation: Secondary | ICD-10-CM

## 2010-12-25 MED ORDER — GUAIFENESIN ER 600 MG PO TB12: 600.0000 mg | ORAL_TABLET | Freq: Two times a day (BID) | ORAL | Status: DC

## 2010-12-25 NOTE — Assessment & Plan Note (Signed)
She was found to have minor, nonobstructive CAD per cardiac cath dated 11/10/2010.  No changes in medications at this time.continue risk reduction measures.

## 2010-12-25 NOTE — Progress Notes (Signed)
HPI: [name] is a pleasant 77 y/o patient of Dr.McDowell usually seen in the [place] office. She is followed for known history of  Aortic stenosis, s/p bioprosthetic AoV replacement in 2005, carotid artery stenosis, hyperlipidemia, AAA, and hypertension. She also has a history of COPD.   She was admitted to [facility] hospital after initially being seen at [facility] for acute onset of Atrial fib and diastolic CHF. She was made hemodynamically stable, converted to NSR after initiation of IV cardizem. She was found to have positive cardiac enzymes.   She was transferred for cardiac catheterization after abnormal stress myoview demonstrating a partially reversible mid basal lateral defect with abnormal wall motion consistent with ischemia.  Also during [place] evaluation, she was treated for pneumonia with Levaquin.  Cardiac catheterization showed minor nonobstructive CAD with normal LV fx.  Coumadin was initiated. She was sent home on po diltiazem 120 mg and reduction of simvastatin to 40 mg with concurrent diltiazem use.  She is being followed by Dr. Sherryll Burger for PT INR.  Since discharge, she has stopped smoking X 1 week.  She states she is feeling well without bleeding issues, recurrent heart racing or shortness of breath. She is coughing a good bit, sometimes productively, without fever or hemoptysis. She remains 02 dependent.  Allergies  Allergen Reactions  . Morphine     REACTION: hallucinate    Current Outpatient Prescriptions  Medication Sig Dispense Refill  . citalopram (CELEXA) 40 MG tablet Take 20 mg by mouth Daily.       Marland Kitchen diltiazem (CARDIZEM CD) 120 MG 24 hr capsule Take 1 capsule by mouth Daily.      . furosemide (LASIX) 40 MG tablet Take 0.5 tablets by mouth Daily.      Marland Kitchen gabapentin (NEURONTIN) 300 MG capsule Take 1 capsule by mouth Three times a day.      Marland Kitchen HYDROcodone-acetaminophen (VICODIN) 5-500 MG per tablet Take 1 tablet by mouth Every 4 hours as needed.      . isosorbide mononitrate  (IMDUR) 30 MG 24 hr tablet Take 1 tablet by mouth Daily.      Marland Kitchen omeprazole (PRILOSEC) 20 MG capsule Take 1 capsule by mouth Daily.      Marland Kitchen rOPINIRole (REQUIP) 0.5 MG tablet Take 1 tablet by mouth Three times a day.      . simvastatin (ZOCOR) 40 MG tablet Take 0.5 tablets by mouth Daily.      Marland Kitchen warfarin (COUMADIN) 5 MG tablet Take 1 tablet by mouth as directed.      Marland Kitchen guaiFENesin (MUCINEX) 600 MG 12 hr tablet Take 1 tablet (600 mg total) by mouth 2 (two) times daily.  60 tablet  2    Past Medical History  Diagnosis Date  . COPD (chronic obstructive pulmonary disease)   . Restless leg syndrome   . Essential hypertension, benign   . Coronary atherosclerosis of native coronary artery     Mild nonobstructive 10/12  . Mixed hyperlipidemia   . Iron deficiency   . Lumbosacral disc disease     Myelopathy  . GERD (gastroesophageal reflux disease)   . Legally blind   . Diverticulosis   . Internal hemorrhoids   . TIA (transient ischemic attack) 09/2010  . Aortic stenosis   . Atrial fibrillation   . Carotid artery disease     Past Surgical History  Procedure Date  . Aortic valve replacement 8/05    19 mm Edwards bovine pericardial prosthesis  . Bilateral oophorectomy   . Abdominal  hysterectomy   . Cholecystectomy   . Back surgery     ZOX:[id number] of systems complete and found to be negative unless listed above  PHYSICAL EXAM BP 122/63  Pulse 72  Resp 18  Ht 5' (1.524 m)  Wt 148 lb (67.132 kg)  BMI 28.90 kg/m2  General: Well developed, well nourished, in no acute distress Head: Eyes PERRLA, No xanthomas.   Normal cephalic and atramatic  Wearing sunglasses (macular degeneration). Lungs: Clear bilaterally to auscultation and percussion. Heart: HRRR S1 S2,2/6 systolic murmur at the LSB radiating into the apex..  Pulses are 2+ & equal.            Positive on the right carotid bruit. No JVD.  No abdominal bruits. No femoral bruits. Abdomen: Bowel sounds are positive, abdomen soft and  non-tender without masses or                  Hernia's noted. Msk:  Back kyphosis is noted,, slow gait. Diminshed strength and tone for age. Extremities: No clubbing, cyanosis or edema.  DP +1 Neuro: Alert and oriented X 3. Psych:  Good affect, responds appropriately  EKG:NSR rate of 71 bpm.   ASSESSMENT AND PLAN

## 2010-12-25 NOTE — Assessment & Plan Note (Signed)
She continued 02 dependent and has a lot of coughing and congestion, despite home breathing treaments and abx at [facility]. I will check another CXR and prescribe muccinex to assist with congestion. BNP to rule out recurrent diastolic CHF.   She will follow-up with Dr. Sherryll Burger for continued treatment of this.

## 2010-12-25 NOTE — Assessment & Plan Note (Signed)
She remains in NSR at this time with Cardizem. She continues on coumadin and is followed by Dr. Sherryll Burger for this.  She is without complaints and appears euvolemic. No changes will be made to her medications at this time. She will follow-up with Dr. Diona Browner in [place] for continued assessment and treatment.

## 2010-12-25 NOTE — Patient Instructions (Signed)
Your physician recommends that you schedule a follow-up appointment in: 1 month in [facility]  Your physician has recommended you make the following change in your medication:  Obtain Mucinex 600 mg and take 2 times a day  Your physician recommends that you return for lab work in: Today  Chest X ray today

## 2010-12-26 LAB — CBC
HCT: 34.1 % — ABNORMAL LOW (ref 36.0–46.0)
Hemoglobin: 10.9 g/dL — ABNORMAL LOW (ref 12.0–15.0)
MCH: 28.9 pg (ref 26.0–34.0)
MCHC: 32 g/dL (ref 30.0–36.0)
MCV: 90.5 fL (ref 78.0–100.0)
RBC: 3.77 MIL/uL — ABNORMAL LOW (ref 3.87–5.11)

## 2010-12-26 LAB — BASIC METABOLIC PANEL
BUN: 12 mg/dL (ref 6–23)
CO2: 29 mEq/L (ref 19–32)
Calcium: 8.9 mg/dL (ref 8.4–10.5)
Chloride: 101 mEq/L (ref 96–112)
Creat: 0.89 mg/dL (ref 0.50–1.10)
Glucose, Bld: 91 mg/dL (ref 70–99)
Potassium: 3.8 mEq/L (ref 3.5–5.3)
Sodium: 139 mEq/L (ref 135–145)

## 2010-12-26 LAB — BRAIN NATRIURETIC PEPTIDE: Brain Natriuretic Peptide: 220.2 pg/mL — ABNORMAL HIGH (ref 0.0–100.0)

## 2011-01-14 ENCOUNTER — Encounter: Payer: Self-pay | Admitting: Cardiology

## 2011-01-26 ENCOUNTER — Encounter: Payer: Self-pay | Admitting: *Deleted

## 2011-01-26 ENCOUNTER — Ambulatory Visit (INDEPENDENT_AMBULATORY_CARE_PROVIDER_SITE_OTHER): Payer: Medicare Other | Admitting: Cardiology

## 2011-01-26 VITALS — BP 103/64 | HR 66 | Ht 60.0 in | Wt 148.0 lb

## 2011-01-26 DIAGNOSIS — I4891 Unspecified atrial fibrillation: Secondary | ICD-10-CM

## 2011-01-26 DIAGNOSIS — Z954 Presence of other heart-valve replacement: Secondary | ICD-10-CM

## 2011-01-26 DIAGNOSIS — I2510 Atherosclerotic heart disease of native coronary artery without angina pectoris: Secondary | ICD-10-CM

## 2011-01-26 DIAGNOSIS — I359 Nonrheumatic aortic valve disorder, unspecified: Secondary | ICD-10-CM

## 2011-01-26 NOTE — Assessment & Plan Note (Signed)
Symptomatically well controlled on current regimen including Cardizem and Coumadin. No reported bleeding problems. Coumadin is being followed by Dr. Sherryll Burger.

## 2011-01-26 NOTE — Progress Notes (Signed)
Clinical Summary [name] is a 77 y.o.female presenting for followup. She was seen by [name] in the [place] office last month for hospital followup. Records reviewed.  Lab work from last month showed potassium 3.8, BUN 12, creatinine 0.8, hemoglobin 10.9, platelets 292.  She states that she feels fairly well. She uses oxygen continuously, and has followup with [name] later this week. She reports no significant chest pain or palpitations. Denies any bleeding problems on Coumadin. Was recently hospitalized at [facility] with an episode of pneumonia. She has completed antibiotics. No cough or hemoptysis.    Allergies  Allergen Reactions  . Morphine     REACTION: hallucinate    Medication list reviewed.  Past Medical History  Diagnosis Date  . COPD (chronic obstructive pulmonary disease)   . Restless leg syndrome   . Essential hypertension, benign   . Coronary atherosclerosis of native coronary artery     Mild nonobstructive 10/12  . Mixed hyperlipidemia   . Iron deficiency   . Lumbosacral disc disease     Myelopathy  . GERD (gastroesophageal reflux disease)   . Legally blind   . Diverticulosis   . Internal hemorrhoids   . TIA (transient ischemic attack) 09/2010  . Aortic stenosis   . Atrial fibrillation   . Carotid artery disease     Past Surgical History  Procedure Date  . Aortic valve replacement 8/05    19 mm Edwards bovine pericardial prosthesis  . Bilateral oophorectomy   . Abdominal hysterectomy   . Cholecystectomy   . Back surgery     Social History [name] reports that she has been smoking Cigarettes.  She has a 32.5 pack-year smoking history. She has never used smokeless tobacco. [name] reports that she does not drink alcohol.  Review of Systems As outlined above, otherwise negative.  Physical Examination Filed Vitals:   01/26/11 1120  BP: 103/64  Pulse: 66   Chronically ill-appearing elderly woman wearing oxygen. HEENT:  Conjunctiva and lids normal, oropharynx with poor dentition. Neck: Supple, no elevated JVP or bruits. Lungs: Coarse, diminished breath sounds, no active wheezing. Cardiac: Regular heart sounds, 2/6 systolic murmur at base, no rub or S3 gallop. Abdomen: Soft, nontender, bowel sounds present. Activities: Trace ankle edema, distal pulses diminished, venous stasis and spider veins noted.  ECG Sinus rhythm at 68, nonspecific ST-T changes.    Problem List and Plan

## 2011-01-26 NOTE — Assessment & Plan Note (Signed)
Echocardiogram in September revealed LVEF 60-65% with moderate LVH, stable aortic bioprosthesis with mean gradient of 29 mm mercury.

## 2011-01-26 NOTE — Patient Instructions (Signed)
   Your physician wants you to follow-up in: 3 months. You will receive a reminder letter in the mail one-two months in advance. If you don't receive a letter, please call our office to schedule the follow-up appointment.  Your physician recommends that you continue on your current medications as directed. Please refer to the Current Medication list given to you today.  

## 2011-01-26 NOTE — Assessment & Plan Note (Signed)
Mild, nonobstructive disease at catheterization October, no active angina.

## 2011-03-15 DIAGNOSIS — I509 Heart failure, unspecified: Secondary | ICD-10-CM

## 2011-03-15 DIAGNOSIS — I5032 Chronic diastolic (congestive) heart failure: Secondary | ICD-10-CM

## 2011-04-30 ENCOUNTER — Ambulatory Visit: Payer: Medicare Other | Admitting: Cardiology

## 2011-05-27 ENCOUNTER — Ambulatory Visit (INDEPENDENT_AMBULATORY_CARE_PROVIDER_SITE_OTHER): Payer: Medicare Other | Admitting: Cardiology

## 2011-05-27 ENCOUNTER — Encounter: Payer: Self-pay | Admitting: Cardiology

## 2011-05-27 VITALS — BP 111/67 | HR 68 | Ht 60.0 in | Wt 159.0 lb

## 2011-05-27 DIAGNOSIS — R635 Abnormal weight gain: Secondary | ICD-10-CM | POA: Insufficient documentation

## 2011-05-27 DIAGNOSIS — I4891 Unspecified atrial fibrillation: Secondary | ICD-10-CM

## 2011-05-27 DIAGNOSIS — I2510 Atherosclerotic heart disease of native coronary artery without angina pectoris: Secondary | ICD-10-CM

## 2011-05-27 DIAGNOSIS — Z954 Presence of other heart-valve replacement: Secondary | ICD-10-CM

## 2011-05-27 NOTE — Assessment & Plan Note (Signed)
Examination stable. Last echocardiogram report reviewed.

## 2011-05-27 NOTE — Assessment & Plan Note (Signed)
Today we discussed reasonable diet and fluid restrictions. She has followup BMET with Dr. Sherryll Burger and an office visit.

## 2011-05-27 NOTE — Assessment & Plan Note (Signed)
No active angina on medical therapy. Continue observation. 

## 2011-05-27 NOTE — Assessment & Plan Note (Signed)
In sinus rhythm today. No complaint of palpitations. She continues on calcium channel blocker and Coumadin.

## 2011-05-27 NOTE — Progress Notes (Signed)
Clinical Summary [name] is a 78 y.o.female presenting for followup. She was seen in December 2012. She is here with her daughter. Main complaint is of increased weight, approximately 10 pounds since last visit. She complains of increased abdominal girth, no significant peripheral edema. There has been concern about increased fluid weight resulting in increase in Lasix dosing. She does seem to drink quite a bit of fluid during the day, sugars and extra carbohydrates also noted. I suspect some of her weight is related to caloric intake as well.  She reports no palpitations however, no orthopnea or PND. Denies any bleeding problems on Coumadin, which is followed by Dr. Sherryll Burger.  Followup ECG today shows sinus rhythm.   Allergies  Allergen Reactions  . Morphine     REACTION: hallucinate    Current Outpatient Prescriptions  Medication Sig Dispense Refill  . citalopram (CELEXA) 40 MG tablet Take 20 mg by mouth Daily.       Marland Kitchen diltiazem (CARDIZEM CD) 120 MG 24 hr capsule Take 1 capsule by mouth Daily.      . furosemide (LASIX) 40 MG tablet Take 80 mg by mouth 2 (two) times daily.       Marland Kitchen guaiFENesin (MUCINEX) 600 MG 12 hr tablet Take 600 mg by mouth 2 (two) times daily.      Marland Kitchen HYDROcodone-acetaminophen (VICODIN) 5-500 MG per tablet Take 1 tablet by mouth Every 4 hours as needed.      Marland Kitchen ipratropium-albuterol (DUONEB) 0.5-2.5 (3) MG/3ML SOLN Take 3 mLs by nebulization every 6 (six) hours as needed.      . isosorbide mononitrate (IMDUR) 30 MG 24 hr tablet Take 1 tablet by mouth Daily.      . metoCLOPramide (REGLAN) 10 MG tablet Take 1 tablet by mouth 4 times daily (after meals and at bedtime).      . nitroGLYCERIN (NITROSTAT) 0.4 MG SL tablet Place 0.4 mg under the tongue every 5 (five) minutes as needed.      Marland Kitchen omeprazole (PRILOSEC) 20 MG capsule Take 1 capsule by mouth 2 (two) times daily.       . potassium chloride SA (K-DUR,KLOR-CON) 20 MEQ tablet Take 1 tablet by mouth 2 (two) times daily.        Marland Kitchen rOPINIRole (REQUIP) 0.5 MG tablet Take 1 tablet by mouth Three times a day.      . simvastatin (ZOCOR) 40 MG tablet Take 40 mg by mouth Daily.       Marland Kitchen warfarin (COUMADIN) 5 MG tablet Take 1 tablet by mouth as directed.        Past Medical History  Diagnosis Date  . COPD (chronic obstructive pulmonary disease)   . Restless leg syndrome   . Essential hypertension, benign   . Coronary atherosclerosis of native coronary artery     Mild nonobstructive 10/12  . Mixed hyperlipidemia   . Iron deficiency   . Lumbosacral disc disease     Myelopathy  . GERD (gastroesophageal reflux disease)   . Legally blind   . Diverticulosis   . Internal hemorrhoids   . TIA (transient ischemic attack) 09/2010  . Aortic stenosis   . Atrial fibrillation   . Carotid artery disease     Past Surgical History  Procedure Date  . Aortic valve replacement 8/05    19 mm Edwards bovine pericardial prosthesis  . Bilateral oophorectomy   . Abdominal hysterectomy   . Cholecystectomy   . Back surgery     Social History [name]  reports that she quit smoking about 4 months ago. Her smoking use included Cigarettes. She has a 32.5 pack-year smoking history. She has never used smokeless tobacco. [name] reports that she does not drink alcohol.  Review of Systems Negative except as outlined above.  Physical Examination Filed Vitals:   05/27/11 1505  BP: 111/67  Pulse: 68    Chronically ill-appearing elderly woman wearing oxygen.  HEENT: Conjunctiva and lids normal, oropharynx with poor dentition.  Neck: Supple, no elevated JVP or bruits.  Lungs: Coarse, diminished breath sounds, no active wheezing.  Cardiac: Regular heart sounds, 2/6 systolic murmur at base, no rub or S3 gallop.  Abdomen: Soft, protuberant, nontender, bowel sounds present.  Extremities: No pitting edema, distal pulses diminished, venous stasis and spider veins noted. Skin: Warm and dry. Musculoskeletal: No  kyphosis. Neuropsychiatric: Alert and oriented x3, affect appropriate.    Problem List and Plan

## 2011-05-27 NOTE — Patient Instructions (Addendum)
Follow up in 3 months. Your physician recommends that you continue on your current medications as directed. Please refer to the Current Medication list given to you today. 

## 2011-08-13 ENCOUNTER — Emergency Department (HOSPITAL_COMMUNITY): Payer: Medicare Other

## 2011-08-13 ENCOUNTER — Encounter (HOSPITAL_COMMUNITY): Payer: Self-pay | Admitting: *Deleted

## 2011-08-13 ENCOUNTER — Emergency Department (HOSPITAL_COMMUNITY)
Admission: EM | Admit: 2011-08-13 | Discharge: 2011-08-13 | Disposition: A | Discharge status: Home / Self Care | Payer: Medicare Other | Attending: Emergency Medicine | Admitting: Emergency Medicine

## 2011-08-13 DIAGNOSIS — J449 Chronic obstructive pulmonary disease, unspecified: Secondary | ICD-10-CM | POA: Insufficient documentation

## 2011-08-13 DIAGNOSIS — I517 Cardiomegaly: Secondary | ICD-10-CM | POA: Insufficient documentation

## 2011-08-13 DIAGNOSIS — S93409A Sprain of unspecified ligament of unspecified ankle, initial encounter: Secondary | ICD-10-CM

## 2011-08-13 DIAGNOSIS — M25569 Pain in unspecified knee: Secondary | ICD-10-CM | POA: Insufficient documentation

## 2011-08-13 DIAGNOSIS — Z87891 Personal history of nicotine dependence: Secondary | ICD-10-CM | POA: Insufficient documentation

## 2011-08-13 DIAGNOSIS — Z79899 Other long term (current) drug therapy: Secondary | ICD-10-CM | POA: Insufficient documentation

## 2011-08-13 DIAGNOSIS — J4489 Other specified chronic obstructive pulmonary disease: Secondary | ICD-10-CM | POA: Insufficient documentation

## 2011-08-13 DIAGNOSIS — K219 Gastro-esophageal reflux disease without esophagitis: Secondary | ICD-10-CM | POA: Insufficient documentation

## 2011-08-13 DIAGNOSIS — I2510 Atherosclerotic heart disease of native coronary artery without angina pectoris: Secondary | ICD-10-CM | POA: Insufficient documentation

## 2011-08-13 DIAGNOSIS — W19XXXA Unspecified fall, initial encounter: Secondary | ICD-10-CM

## 2011-08-13 DIAGNOSIS — G319 Degenerative disease of nervous system, unspecified: Secondary | ICD-10-CM | POA: Insufficient documentation

## 2011-08-13 DIAGNOSIS — Y9289 Other specified places as the place of occurrence of the external cause: Secondary | ICD-10-CM | POA: Insufficient documentation

## 2011-08-13 DIAGNOSIS — X500XXA Overexertion from strenuous movement or load, initial encounter: Secondary | ICD-10-CM | POA: Insufficient documentation

## 2011-08-13 DIAGNOSIS — R404 Transient alteration of awareness: Secondary | ICD-10-CM | POA: Insufficient documentation

## 2011-08-13 DIAGNOSIS — Z8673 Personal history of transient ischemic attack (TIA), and cerebral infarction without residual deficits: Secondary | ICD-10-CM | POA: Insufficient documentation

## 2011-08-13 DIAGNOSIS — I10 Essential (primary) hypertension: Secondary | ICD-10-CM | POA: Insufficient documentation

## 2011-08-13 DIAGNOSIS — I4891 Unspecified atrial fibrillation: Secondary | ICD-10-CM | POA: Insufficient documentation

## 2011-08-13 LAB — CBC WITH DIFFERENTIAL/PLATELET
Basophils Absolute: 0 10*3/uL (ref 0.0–0.1)
Basophils Relative: 0 % (ref 0–1)
Eosinophils Absolute: 0.1 10*3/uL (ref 0.0–0.7)
Hemoglobin: 12 g/dL (ref 12.0–15.0)
MCH: 28.8 pg (ref 26.0–34.0)
MCHC: 32.4 g/dL (ref 30.0–36.0)
Monocytes Relative: 10 % (ref 3–12)
Neutro Abs: 4.7 10*3/uL (ref 1.7–7.7)
Neutrophils Relative %: 77 % (ref 43–77)
RDW: 13.8 % (ref 11.5–15.5)

## 2011-08-13 LAB — BASIC METABOLIC PANEL
BUN: 18 mg/dL (ref 6–23)
Creatinine, Ser: 0.94 mg/dL (ref 0.50–1.10)
GFR calc Af Amer: 64 mL/min — ABNORMAL LOW (ref >90)
GFR calc non Af Amer: 55 mL/min — ABNORMAL LOW (ref >90)
Potassium: 4.2 mEq/L (ref 3.5–5.1)

## 2011-08-13 LAB — PROTIME-INR: INR: 1.96 — ABNORMAL HIGH (ref 0.00–1.49)

## 2011-08-13 MED ORDER — HYDROMORPHONE HCL PF 1 MG/ML IJ SOLN
0.5000 mg | Freq: Once | INTRAMUSCULAR | Status: AC
  Administered 2011-08-13: 0.5 mg via INTRAVENOUS
  Filled 2011-08-13: qty 1

## 2011-08-13 MED ORDER — IBUPROFEN 800 MG PO TABS: 800.0000 mg | ORAL_TABLET | Freq: Three times a day (TID) | ORAL | Status: AC

## 2011-08-13 MED ORDER — HYDROCODONE-ACETAMINOPHEN 5-325 MG PO TABS: 2.0000 | ORAL_TABLET | ORAL | Status: AC | PRN

## 2011-08-13 MED ORDER — OXYCODONE-ACETAMINOPHEN 5-325 MG PO TABS
1.0000 | ORAL_TABLET | Freq: Once | ORAL | Status: AC
  Administered 2011-08-13: 1 via ORAL
  Filled 2011-08-13: qty 1

## 2011-08-13 NOTE — ED Notes (Signed)
Fell at [employer], pain in right knee and right ankle

## 2011-08-13 NOTE — ED Notes (Signed)
Pt stable at discharge.  

## 2011-08-13 NOTE — ED Provider Notes (Signed)
History     CSN: [id number]  Arrival date & time 08/13/11  1653   First MD Initiated Contact with Patient 08/13/11 1714      Chief Complaint  Patient presents with  . Leg Injury    (Consider location/radiation/quality/duration/timing/severity/associated sxs/prior treatment) HPI Comments: Patient presents with right knee and right ankle pain after mechanical fall. She trying to get into her car in the parking lot of a grocery store she twisted her ankle went on her buttock and right elbow. Denied hitting her head or lose consciousness. Complains of pain to the right lateral ankle and right lateral knee. She denies any headache, neck, back, abdominal pain or chest pain. She is on Coumadin for history of aortic valve replacement.  The history is provided by the patient.    Past Medical History  Diagnosis Date  . COPD (chronic obstructive pulmonary disease)   . Restless leg syndrome   . Essential hypertension, benign   . Coronary atherosclerosis of native coronary artery     Mild nonobstructive 10/12  . Mixed hyperlipidemia   . Iron deficiency   . Lumbosacral disc disease     Myelopathy  . GERD (gastroesophageal reflux disease)   . Legally blind   . Diverticulosis   . Internal hemorrhoids   . TIA (transient ischemic attack) 09/2010  . Aortic stenosis   . Atrial fibrillation   . Carotid artery disease     Past Surgical History  Procedure Date  . Aortic valve replacement 8/05    19 mm Edwards bovine pericardial prosthesis  . Bilateral oophorectomy   . Abdominal hysterectomy   . Cholecystectomy   . Back surgery     No family history on file.  History  Substance Use Topics  . Smoking status: Former Smoker -- 0.5 packs/day for 65 years    Types: Cigarettes    Quit date: 01/12/2011  . Smokeless tobacco: Never Used   Comment: at last office visit patient said she quit smoking 11/2009  . Alcohol Use: No    OB History    Grav Para Term Preterm Abortions TAB SAB Ect  Mult Living                  Review of Systems  Constitutional: Negative for fever and activity change.  HENT: Negative for congestion, rhinorrhea and neck pain.   Eyes: Negative for visual disturbance.  Respiratory: Negative for cough, chest tightness and shortness of breath.   Cardiovascular: Negative for chest pain.  Gastrointestinal: Negative for nausea, vomiting and abdominal pain.  Genitourinary: Negative for dysuria, vaginal bleeding and vaginal discharge.  Musculoskeletal: Positive for myalgias, joint swelling, arthralgias and gait problem. Negative for back pain.  Skin: Negative for rash.  Neurological: Negative for headaches.    Allergies  Morphine  Home Medications   Current Outpatient Rx  Name Route Sig Dispense Refill  . BUDESONIDE-FORMOTEROL FUMARATE 80-4.5 MCG/ACT IN AERO Inhalation Inhale 2 puffs into the lungs 2 (two) times daily.    Marland Kitchen CITALOPRAM HYDROBROMIDE 40 MG PO TABS Oral Take 20 mg by mouth every morning.     Marland Kitchen DILTIAZEM HCL ER COATED BEADS 120 MG PO CP24 Oral Take 1 capsule by mouth every morning.     . FUROSEMIDE 40 MG PO TABS Oral Take 80 mg by mouth 2 (two) times daily.     . GUAIFENESIN ER 600 MG PO TB12 Oral Take 600 mg by mouth 2 (two) times daily.    Marland Kitchen HYDROCODONE-ACETAMINOPHEN 5-500 MG PO  TABS Oral Take 1 tablet by mouth Every 4 hours as needed.    . IPRATROPIUM-ALBUTEROL 0.5-2.5 (3) MG/3ML IN SOLN Nebulization Take 3 mLs by nebulization every 6 (six) hours as needed.    . ISOSORBIDE MONONITRATE ER 30 MG PO TB24 Oral Take 1 tablet by mouth every morning.     Marland Kitchen METOCLOPRAMIDE HCL 10 MG PO TABS Oral Take 1 tablet by mouth 4 times daily (after meals and at bedtime).    Marland Kitchen NITROGLYCERIN 0.4 MG SL SUBL Sublingual Place 0.4 mg under the tongue every 5 (five) minutes as needed.    Marland Kitchen OMEPRAZOLE 20 MG PO CPDR Oral Take 1 capsule by mouth 2 (two) times daily.     Marland Kitchen POTASSIUM CHLORIDE CRYS ER 20 MEQ PO TBCR Oral Take 1 tablet by mouth 2 (two) times daily.       Marland Kitchen ROPINIROLE HCL 0.5 MG PO TABS Oral Take 1 tablet by mouth Three times a day.    Marland Kitchen SIMVASTATIN 40 MG PO TABS Oral Take 40 mg by mouth every evening.     . WARFARIN SODIUM 5 MG PO TABS Oral Take 2.5 tablets by mouth every evening.       BP 112/53  Pulse 76  Temp 98.5 F (36.9 C)  Resp 20  SpO2 86%  Physical Exam  Constitutional: She is oriented to person, place, and time. She appears well-developed and well-nourished. No distress.  HENT:  Head: Normocephalic and atraumatic.  Mouth/Throat: Oropharynx is clear and moist. No oropharyngeal exudate.  Eyes: Conjunctivae and EOM are normal. Pupils are equal, round, and reactive to light.  Neck: Normal range of motion. Neck supple.       No C spine pain, stepoff or deformity  Cardiovascular: Normal rate, regular rhythm and normal heart sounds.   No murmur heard. Pulmonary/Chest: Effort normal and breath sounds normal. No respiratory distress.  Abdominal: Soft. There is no tenderness. There is no rebound and no guarding.  Musculoskeletal: Normal range of motion. She exhibits tenderness.       Tenderness and swelling over R lateral malleolus.  +2 DP and PT pulses. Able to wiggle toes.  Ankle plantar and dorsiflexion intact. TTP R lateral knee.  Flexion and extension intact.  Neurological: She is alert and oriented to person, place, and time. No cranial nerve deficit.  Skin: Skin is warm.    ED Course  Procedures (including critical care time)  Labs Reviewed  CBC WITH DIFFERENTIAL - Abnormal; Notable for the following:    Lymphocytes Relative 11 (*)     All other components within normal limits  BASIC METABOLIC PANEL - Abnormal; Notable for the following:    Glucose, Bld 100 (*)     GFR calc non Af Amer 55 (*)     GFR calc Af Amer 64 (*)     All other components within normal limits  PROTIME-INR - Abnormal; Notable for the following:    Prothrombin Time 22.7 (*)     INR 1.96 (*)     All other components within normal limits   Dg  Chest 1 View  08/13/2011  *RADIOLOGY REPORT*  Clinical Data: Fall.  History of aortic valve replacement.  CHEST - 1 VIEW  Comparison: Two-view chest x-ray 12/25/2010, 10/14/2010.  Findings: Prior sternotomy for aortic valve replacement.  Cardiac silhouette moderately enlarged but stable.  Thoracic aorta atherosclerotic, unchanged.  Hilar and mediastinal contours otherwise unremarkable.  Emphysematous changes in the upper lobes, right greater than left.  Prominent bronchovascular  markings diffusely, unchanged.  No new pulmonary parenchymal abnormalities. No visible pleural effusions.  Patient rotated to the right.  IMPRESSION: Stable cardiomegaly and COPD/emphysema.  No acute cardiopulmonary disease.  Original Report Authenticated By: Arnell Sieving, M.D.   Dg Ankle Complete Right  08/13/2011  *RADIOLOGY REPORT*  Clinical Data: Fall.  Right leg pain.  RIGHT ANKLE - COMPLETE 3+ VIEW  Comparison: None.  Findings: Anatomic alignment of the right ankle.  Well corticated avulsion fracture fragment adjacent to the tip of the lateral malleolus.  There is no fracture.  Ankle osteoarthritis.  Calcaneal spurs incidentally noted.  Severe midfoot osteoarthritis.  Talar dome appears intact.  IMPRESSION: No acute osseous abnormality.  Original Report Authenticated By: Andreas Newport, M.D.   Ct Head Wo Contrast  08/13/2011  *RADIOLOGY REPORT*  Clinical Data: Fall.  Altered level of consciousness.  CT HEAD WITHOUT CONTRAST  Technique:  Contiguous axial images were obtained from the base of the skull through the vertex without contrast.  Comparison: None.  Findings: Intracranial atherosclerosis is present.  Calvarium intact. No mass lesion, mass effect, midline shift, hydrocephalus, hemorrhage.  No acute territorial cortical ischemia/infarct. Atrophy and chronic ischemic white matter disease is present.  IMPRESSION: Atrophy and chronic ischemic white matter disease without acute intracranial abnormality.  Original Report  Authenticated By: Andreas Newport, M.D.   Dg Knee Complete 4 Views Right  08/13/2011  *RADIOLOGY REPORT*  Clinical Data: Fall.  Right leg pain.  RIGHT KNEE - COMPLETE 4+ VIEW  Comparison: None.  Findings: Moderate tricompartmental osteoarthritis is present. There is no knee effusion.  Chondrocalcinosis of the menisci. Below-the-knee atherosclerosis is present. Anatomic alignment.  IMPRESSION: No acute osseous abnormality.  Original Report Authenticated By: Andreas Newport, M.D.     No diagnosis found.    MDM  Mechanical fall with R knee and ankle pain, on coumadin.  No headache or neuro deficit.  Imaging negative for acute traumatic pathology. We'll provide ASO to brace for ankle. Patient has cane and walker at home.  CT head negative. INR 1.96. Follow up PCP and ortho PRN.       Glynn Octave, MD 08/13/11 1925

## 2011-08-13 NOTE — ED Notes (Signed)
Placed on 2 liters via cannula, edp notified of patient's request for pain meds

## 2011-08-25 ENCOUNTER — Other Ambulatory Visit: Payer: Self-pay

## 2011-08-25 DIAGNOSIS — I6789 Other cerebrovascular disease: Secondary | ICD-10-CM

## 2011-08-31 ENCOUNTER — Encounter: Payer: Self-pay | Admitting: Cardiology

## 2011-08-31 ENCOUNTER — Ambulatory Visit (INDEPENDENT_AMBULATORY_CARE_PROVIDER_SITE_OTHER): Payer: Medicare Other | Admitting: Cardiology

## 2011-08-31 VITALS — BP 117/67 | HR 55 | Ht 60.0 in | Wt 158.4 lb

## 2011-08-31 DIAGNOSIS — Z79899 Other long term (current) drug therapy: Secondary | ICD-10-CM

## 2011-08-31 DIAGNOSIS — I2510 Atherosclerotic heart disease of native coronary artery without angina pectoris: Secondary | ICD-10-CM

## 2011-08-31 DIAGNOSIS — Z954 Presence of other heart-valve replacement: Secondary | ICD-10-CM

## 2011-08-31 DIAGNOSIS — E785 Hyperlipidemia, unspecified: Secondary | ICD-10-CM

## 2011-08-31 MED ORDER — SIMVASTATIN 20 MG PO TABS: 20.0000 mg | ORAL_TABLET | Freq: Every evening | ORAL | Status: DC

## 2011-08-31 NOTE — Assessment & Plan Note (Signed)
Will reduce Zocor to 20 mg daily given concurrent use of diltiazem. Recheck FLP and LFT in 3 months for next office visit.

## 2011-08-31 NOTE — Assessment & Plan Note (Signed)
She is in sinus rhythm on examination today. She reports compliance with Coumadin. Needs to strive for therapeutic INR in the range of 2.0-3.0 to reduce risk of stroke. Her Coumadin is followed by Dr. Sherryll Burger.

## 2011-08-31 NOTE — Progress Notes (Signed)
Clinical Summary Ms. Helming is a 78 y.o.female presenting for followup. She was seen in April. Record review finds recent hospital admission with apparent TIA, no stroke by brain MRI. She has a history of atrial fibrillation, although is on Coumadin. Her INR was subtherapeutic at hospitalization.  Last echocardiogram from February of this year revealed LVEF of 60-65% with basal septal hypertrophy and SAM, mild resting LVOT gradient, diastolic dysfunction, bioprosthetic aortic valve with mild regurgitation, mean gradient 22 mm mercury, mildly restricted mitral leaflets.  Recent lab work reviewed finding BUN 20, creatinine 0.9, potassium 3.5, INR 1.9 and 1.7.  She is here with her daughter. She states that she is back to baseline. She is due to followup with Dr. Sherryll Burger for INR check soon.  We reviewed her medications today and discussed a reduction in dose of simvastatin given concurrent use of diltiazem.  Allergies  Allergen Reactions  . Morphine     REACTION: hallucinate    Current Outpatient Prescriptions  Medication Sig Dispense Refill  . budesonide-formoterol (SYMBICORT) 80-4.5 MCG/ACT inhaler Inhale 2 puffs into the lungs 2 (two) times daily.      . citalopram (CELEXA) 40 MG tablet Take 20 mg by mouth every morning.       . diltiazem (CARDIZEM CD) 120 MG 24 hr capsule Take 1 capsule by mouth every morning.       . furosemide (LASIX) 40 MG tablet Take 80 mg by mouth 2 (two) times daily.       Marland Kitchen guaiFENesin (MUCINEX) 600 MG 12 hr tablet Take 600 mg by mouth 2 (two) times daily.      Marland Kitchen HYDROcodone-acetaminophen (VICODIN) 5-500 MG per tablet Take 1 tablet by mouth Every 4 hours as needed.      Marland Kitchen ipratropium-albuterol (DUONEB) 0.5-2.5 (3) MG/3ML SOLN Take 3 mLs by nebulization every 6 (six) hours as needed.      . isosorbide mononitrate (IMDUR) 30 MG 24 hr tablet Take 1 tablet by mouth every morning.       . metoCLOPramide (REGLAN) 10 MG tablet Take 1 tablet by mouth 4 times daily (after  meals and at bedtime).      Marland Kitchen omeprazole (PRILOSEC) 20 MG capsule Take 1 capsule by mouth 2 (two) times daily.       . potassium chloride SA (K-DUR,KLOR-CON) 20 MEQ tablet Take 1 tablet by mouth 2 (two) times daily.       Marland Kitchen rOPINIRole (REQUIP) 0.5 MG tablet Take 1 tablet by mouth Three times a day.      . simvastatin (ZOCOR) 40 MG tablet Take 20 mg by mouth every evening. Dose decreased      . warfarin (COUMADIN) 5 MG tablet Take 2.5 tablets by mouth every evening. Managed by Dr. Sherryll Burger      . nitroGLYCERIN (NITROSTAT) 0.4 MG SL tablet Place 0.4 mg under the tongue every 5 (five) minutes as needed.        Past Medical History  Diagnosis Date  . COPD (chronic obstructive pulmonary disease)   . Restless leg syndrome   . Essential hypertension, benign   . Coronary atherosclerosis of native coronary artery     Mild nonobstructive 10/12  . Mixed hyperlipidemia   . Iron deficiency   . Lumbosacral disc disease     Myelopathy  . GERD (gastroesophageal reflux disease)   . Legally blind   . Diverticulosis   . Internal hemorrhoids   . TIA (transient ischemic attack) 09/2010  . Aortic stenosis   .  Atrial fibrillation   . Carotid artery disease     Past Surgical History  Procedure Date  . Aortic valve replacement 8/05    19 mm Edwards bovine pericardial prosthesis  . Bilateral oophorectomy   . Abdominal hysterectomy   . Cholecystectomy   . Back surgery     Social History Ms. Gohlke reports that she quit smoking about 7 months ago. Her smoking use included Cigarettes. She has a 32.5 pack-year smoking history. She has never used smokeless tobacco. Ms. Gosney reports that she does not drink alcohol.  Review of Systems No palpitations. No reported bleeding problems.  Physical Examination Filed Vitals:   08/31/11 1351  BP: 117/67  Pulse: 55    Chronically ill-appearing elderly woman wearing oxygen.  HEENT: Conjunctiva and lids normal, oropharynx with poor dentition.  Neck:  Supple, no elevated JVP or bruits.  Lungs: Coarse, diminished breath sounds, no active wheezing.  Cardiac: Regular heart sounds, 2/6 systolic murmur at base, no rub or S3 gallop.  Abdomen: Soft, protuberant, nontender, bowel sounds present.  Extremities: No pitting edema, distal pulses diminished, venous stasis and spider veins noted.  Skin: Warm and dry.  Musculoskeletal: No kyphosis.  Neuropsychiatric: Alert and oriented x3, affect appropriate.    Problem List and Plan   Paroxysmal atrial fibrillation She is in sinus rhythm on examination today. She reports compliance with Coumadin. Needs to strive for therapeutic INR in the range of 2.0-3.0 to reduce risk of stroke. Her Coumadin is followed by Dr. Sherryll Burger.  HYPERLIPIDEMIA-MIXED Will reduce Zocor to 20 mg daily given concurrent use of diltiazem. Recheck FLP and LFT in 3 months for next office visit.  AORTIC VALVE REPLACEMENT, HX OF Stable by her most recent echocardiogram in February, mild aortic regurgitation, mean gradient 22 mm mercury.  CAD, NATIVE VESSEL Prior documentation of mild nonobstructive disease, no active angina.    Jonelle Sidle, M.D., F.A.C.C.

## 2011-08-31 NOTE — Patient Instructions (Addendum)
Your physician recommends that you schedule a follow-up appointment in: 3 months with Dr. Diona Browner. Your physician has recommended you make the following change in your medication: Decrease you simvastatin to 20 mg daily. You may break your 40 mg tablet in half until they are finished and your new prescription has been sent to your pharamcy. Your physician recommends that you return for a FASTING lipid/liver profile: FLP/LFT at [facility].

## 2011-08-31 NOTE — Assessment & Plan Note (Signed)
Stable by her most recent echocardiogram in February, mild aortic regurgitation, mean gradient 22 mm mercury. 

## 2011-08-31 NOTE — Assessment & Plan Note (Signed)
Prior documentation of mild nonobstructive disease, no active angina.

## 2011-12-01 ENCOUNTER — Encounter: Payer: Self-pay | Admitting: Cardiology

## 2011-12-01 ENCOUNTER — Ambulatory Visit (INDEPENDENT_AMBULATORY_CARE_PROVIDER_SITE_OTHER): Payer: Medicare Other | Admitting: Cardiology

## 2011-12-01 VITALS — BP 122/73 | HR 67 | Ht 60.0 in | Wt 159.1 lb

## 2011-12-01 DIAGNOSIS — I4891 Unspecified atrial fibrillation: Secondary | ICD-10-CM

## 2011-12-01 DIAGNOSIS — I2510 Atherosclerotic heart disease of native coronary artery without angina pectoris: Secondary | ICD-10-CM

## 2011-12-01 DIAGNOSIS — Z954 Presence of other heart-valve replacement: Secondary | ICD-10-CM

## 2011-12-01 DIAGNOSIS — Z79899 Other long term (current) drug therapy: Secondary | ICD-10-CM

## 2011-12-01 DIAGNOSIS — I480 Paroxysmal atrial fibrillation: Secondary | ICD-10-CM

## 2011-12-01 DIAGNOSIS — E785 Hyperlipidemia, unspecified: Secondary | ICD-10-CM

## 2011-12-01 NOTE — Assessment & Plan Note (Signed)
Needs followup FLP and LFTs. Zocor was reduced to 20 mg daily at the last visit.

## 2011-12-01 NOTE — Patient Instructions (Addendum)
Your physician recommends that you schedule a follow-up appointment in: 4 months. Your physician recommends that you continue on your current medications as directed. Please refer to the Current Medication list given to you today.  Your physician recommends that you return for a FASTING lipid/liver profile & BMET:this week no later than 12/03/11 at [facility].

## 2011-12-01 NOTE — Addendum Note (Signed)
Addended by: Eustace Moore on: 12/01/2011 03:06 PM   Modules accepted: Orders

## 2011-12-01 NOTE — Assessment & Plan Note (Signed)
Maintaining sinus rhythm, no complaint of palpitations. Continue current regimen including Cardizem and Coumadin.

## 2011-12-01 NOTE — Progress Notes (Signed)
Clinical Summary Ms. Alsteen is an 78 y.o.female presenting for followup. She was seen in July. She continues to use regular oxygen supplementation, reports no hospitalizations since last visit. No palpitations or chest pain. No reported bleeding problems, Coumadin is followed by Dr. Sherryll Burger.  ECG reviewed today showing sinus rhythm with single PAC.   Allergies  Allergen Reactions  . Morphine     REACTION: hallucinate    Current Outpatient Prescriptions  Medication Sig Dispense Refill  . budesonide-formoterol (SYMBICORT) 80-4.5 MCG/ACT inhaler Inhale 2 puffs into the lungs 2 (two) times daily.      . citalopram (CELEXA) 20 MG tablet Take 20 mg by mouth daily.      Marland Kitchen diltiazem (CARDIZEM CD) 120 MG 24 hr capsule Take 1 capsule by mouth every morning.       . furosemide (LASIX) 40 MG tablet Take 80 mg by mouth 2 (two) times daily.       Marland Kitchen guaiFENesin (MUCINEX) 600 MG 12 hr tablet Take 600 mg by mouth 2 (two) times daily.      Marland Kitchen HYDROcodone-acetaminophen (NORCO/VICODIN) 5-325 MG per tablet Take 1 tablet by mouth 4 (four) times daily as needed.      Marland Kitchen ipratropium-albuterol (DUONEB) 0.5-2.5 (3) MG/3ML SOLN Take 3 mLs by nebulization 4 (four) times daily.       . isosorbide mononitrate (IMDUR) 30 MG 24 hr tablet Take 1 tablet by mouth every morning.       . metoCLOPramide (REGLAN) 10 MG tablet Take 1 tablet by mouth 4 (four) times daily.       . nitroGLYCERIN (NITROSTAT) 0.4 MG SL tablet Place 0.4 mg under the tongue every 5 (five) minutes as needed.      Marland Kitchen omeprazole (PRILOSEC) 20 MG capsule Take 1 capsule by mouth 2 (two) times daily.       . potassium chloride SA (K-DUR,KLOR-CON) 20 MEQ tablet Take 1 tablet by mouth 2 (two) times daily.       Marland Kitchen rOPINIRole (REQUIP) 0.5 MG tablet Take 1 tablet by mouth 3 (three) times daily as needed.       . simvastatin (ZOCOR) 20 MG tablet Take 1 tablet (20 mg total) by mouth every evening.  30 tablet  3  . warfarin (COUMADIN) 5 MG tablet Take 2.5 tablets by  mouth every evening. Managed by Dr. Sherryll Burger        Past Medical History  Diagnosis Date  . COPD (chronic obstructive pulmonary disease)   . Restless leg syndrome   . Essential hypertension, benign   . Coronary atherosclerosis of native coronary artery     Mild nonobstructive 10/12  . Mixed hyperlipidemia   . Iron deficiency   . Lumbosacral disc disease     Myelopathy  . GERD (gastroesophageal reflux disease)   . Legally blind   . Diverticulosis   . Internal hemorrhoids   . TIA (transient ischemic attack) 09/2010  . Aortic stenosis     Status bioprosthetic AVR  . Atrial fibrillation   . Carotid artery disease     Past Surgical History  Procedure Date  . Aortic valve replacement 8/05    19 mm Edwards bovine pericardial prosthesis  . Bilateral oophorectomy   . Abdominal hysterectomy   . Cholecystectomy   . Back surgery     Social History Ms. Hoots reports that she quit smoking about 10 months ago. Her smoking use included Cigarettes. She has a 32.5 pack-year smoking history. She has never used smokeless tobacco. Ms.  Ratledge reports that she does not drink alcohol.  Review of Systems No progressive shortness of breath. Stable appetite. No reported bleeding episodes. No falls.  Physical Examination Filed Vitals:   12/01/11 1325  BP: 122/73  Pulse: 67   Filed Weights   12/01/11 1325  Weight: 159 lb 1.9 oz (72.176 kg)   HEENT: Conjunctiva and lids normal, oropharynx with poor dentition.  Neck: Supple, no elevated JVP or bruits.  Lungs: Coarse, diminished breath sounds, no active wheezing.  Cardiac: Regular heart sounds, 2/6 systolic murmur at base, no rub or S3 gallop.  Abdomen: Soft, protuberant, nontender, bowel sounds present.  Extremities: No pitting edema, distal pulses diminished, venous stasis and spider veins noted.    Problem List and Plan   Paroxysmal atrial fibrillation Maintaining sinus rhythm, no complaint of palpitations. Continue current regimen  including Cardizem and Coumadin.  HYPERLIPIDEMIA-MIXED Needs followup FLP and LFTs. Zocor was reduced to 20 mg daily at the last visit.  CAD, NATIVE VESSEL Nonobstructive disease documented previously, no active angina.  AORTIC VALVE REPLACEMENT, HX OF Stable by her most recent echocardiogram in February, mild aortic regurgitation, mean gradient 22 mm mercury.    Jonelle Sidle, M.D., F.A.C.C.

## 2011-12-01 NOTE — Assessment & Plan Note (Signed)
Stable by her most recent echocardiogram in February, mild aortic regurgitation, mean gradient 22 mm mercury.

## 2011-12-01 NOTE — Assessment & Plan Note (Signed)
Nonobstructive disease documented previously, no active angina.

## 2011-12-07 ENCOUNTER — Telehealth: Payer: Self-pay | Admitting: *Deleted

## 2011-12-07 NOTE — Telephone Encounter (Signed)
Daughter informed. Nurse called to [facility] requesting why BMET wasn't done since patient requested to have this done also. Awaiting callback from Lab.

## 2011-12-07 NOTE — Telephone Encounter (Signed)
Message copied by Eustace Moore on Tue Dec 07, 2011  9:58 AM ------      Message from: Jonelle Sidle      Created: Fri Dec 03, 2011  3:40 PM       LFTs normal and LDL 72. Looks good.

## 2011-12-07 NOTE — Telephone Encounter (Signed)
Spoke with lab and was able to add on BMET. Daughter and patient informed via message machine.

## 2012-03-07 ENCOUNTER — Other Ambulatory Visit: Payer: Self-pay | Admitting: Cardiology

## 2012-04-04 ENCOUNTER — Ambulatory Visit (INDEPENDENT_AMBULATORY_CARE_PROVIDER_SITE_OTHER): Payer: Medicare Other | Admitting: Cardiology

## 2012-04-04 ENCOUNTER — Encounter: Payer: Self-pay | Admitting: Cardiology

## 2012-04-04 VITALS — BP 122/75 | HR 72 | Ht 60.0 in | Wt 154.8 lb

## 2012-04-04 DIAGNOSIS — J449 Chronic obstructive pulmonary disease, unspecified: Secondary | ICD-10-CM

## 2012-04-04 DIAGNOSIS — Z954 Presence of other heart-valve replacement: Secondary | ICD-10-CM

## 2012-04-04 DIAGNOSIS — I480 Paroxysmal atrial fibrillation: Secondary | ICD-10-CM

## 2012-04-04 DIAGNOSIS — E785 Hyperlipidemia, unspecified: Secondary | ICD-10-CM

## 2012-04-04 DIAGNOSIS — I4891 Unspecified atrial fibrillation: Secondary | ICD-10-CM

## 2012-04-04 DIAGNOSIS — I2510 Atherosclerotic heart disease of native coronary artery without angina pectoris: Secondary | ICD-10-CM

## 2012-04-04 NOTE — Assessment & Plan Note (Signed)
Severe, oxygen dependent, followed by Dr. Sherryll Burger.

## 2012-04-04 NOTE — Progress Notes (Signed)
Clinical Summary [name] is an 79 y.o.female presenting for followup. She was last seen in October 2013. She is here with her daughter today. Record review finds hospitalization at [facility] back in January with pneumonia and COPD exacerbation.  She has been back home since then, still on continuous oxygen. Functionally limited due to shortness of breath, also poor eyesight. She denies any angina, no palpitations.  Lab work in October 2013 showed normal LFTs, triglycerides 73, cholesterol 148, HDL 61, LDL 72. ECG today shows sinus rhythm.   Allergies  Allergen Reactions  . Morphine     REACTION: hallucinate    Current Outpatient Prescriptions  Medication Sig Dispense Refill  . budesonide-formoterol (SYMBICORT) 80-4.5 MCG/ACT inhaler Inhale 2 puffs into the lungs 2 (two) times daily.      . citalopram (CELEXA) 20 MG tablet Take 20 mg by mouth daily.      Marland Kitchen diltiazem (CARDIZEM CD) 120 MG 24 hr capsule Take 1 capsule by mouth every morning.       . furosemide (LASIX) 40 MG tablet Take 80 mg by mouth 2 (two) times daily.       Marland Kitchen guaiFENesin (MUCINEX) 600 MG 12 hr tablet Take 600 mg by mouth 2 (two) times daily.      Marland Kitchen HYDROcodone-acetaminophen (NORCO/VICODIN) 5-325 MG per tablet Take 1 tablet by mouth 4 (four) times daily as needed.      Marland Kitchen ipratropium-albuterol (DUONEB) 0.5-2.5 (3) MG/3ML SOLN Take 3 mLs by nebulization 4 (four) times daily.       . isosorbide mononitrate (IMDUR) 30 MG 24 hr tablet Take 1 tablet by mouth every morning.       . metoCLOPramide (REGLAN) 10 MG tablet Take 1 tablet by mouth 4 (four) times daily.       . nitroGLYCERIN (NITROSTAT) 0.4 MG SL tablet Place 0.4 mg under the tongue every 5 (five) minutes as needed.      Marland Kitchen omeprazole (PRILOSEC) 20 MG capsule Take 1 capsule by mouth 2 (two) times daily.       . potassium chloride SA (K-DUR,KLOR-CON) 20 MEQ tablet Take 1 tablet by mouth 2 (two) times daily.       Marland Kitchen rOPINIRole (REQUIP) 0.5 MG tablet Take 1 tablet by  mouth 3 (three) times daily as needed.       . simvastatin (ZOCOR) 20 MG tablet TAKE ONE TABLET BY MOUTH IN THE EVENING  30 tablet  11  . warfarin (COUMADIN) 5 MG tablet Take 2.5 tablets by mouth every evening. Managed by Dr. Sherryll Burger       No current facility-administered medications for this visit.    Past Medical History  Diagnosis Date  . COPD (chronic obstructive pulmonary disease)   . Restless leg syndrome   . Essential hypertension, benign   . Coronary atherosclerosis of native coronary artery     Mild nonobstructive 10/12  . Mixed hyperlipidemia   . Iron deficiency   . Lumbosacral disc disease     Myelopathy  . GERD (gastroesophageal reflux disease)   . Legally blind   . Diverticulosis   . Internal hemorrhoids   . TIA (transient ischemic attack) 09/2010  . Aortic stenosis     Status bioprosthetic AVR  . Atrial fibrillation   . Carotid artery disease     Past Surgical History  Procedure Laterality Date  . Aortic valve replacement  8/05    19 mm Edwards bovine pericardial prosthesis  . Bilateral oophorectomy    . Abdominal hysterectomy    .  Cholecystectomy    . Back surgery      Social History [name] reports that she quit smoking about 14 months ago. Her smoking use included Cigarettes. She has a 32.5 pack-year smoking history. She has never used smokeless tobacco. [name] reports that she does not drink alcohol.  Review of Systems No reported bleeding episodes. Stable appetite. Otherwise negative.  Physical Examination Filed Vitals:   04/04/12 1121  BP: 122/75  Pulse: 72   Filed Weights   04/04/12 1121  Weight: 154 lb 12.8 oz (70.217 kg)   NAD. HEENT: Conjunctiva and lids normal, oropharynx with poor dentition.  Neck: Supple, no elevated JVP or bruits.  Lungs: Coarse, diminished breath sounds, no active wheezing.  Cardiac: Regular heart sounds, 2/6 systolic murmur at base, no rub or S3 gallop.  Abdomen: Soft, protuberant, nontender, bowel  sounds present.  Extremities: No pitting edema, distal pulses diminished, venous stasis and spider veins noted.    Problem List and Plan   CAD, NATIVE VESSEL No active angina with history of mild nonobstructive disease.  AORTIC VALVE REPLACEMENT, HX OF Status post bioprosthetic AVR, stable followup echocardiogram last year.  COPD (chronic obstructive pulmonary disease) Severe, oxygen dependent, followed by Dr. Sherryll Burger.  HYPERLIPIDEMIA-MIXED Has been well controlled on current statin dose.  Paroxysmal atrial fibrillation In sinus rhythm today. She continues on Coumadin.    Jonelle Sidle, M.D., F.A.C.C.

## 2012-04-04 NOTE — Assessment & Plan Note (Signed)
Status post bioprosthetic AVR, stable followup echocardiogram last year.

## 2012-04-04 NOTE — Assessment & Plan Note (Signed)
No active angina with history of mild nonobstructive disease.

## 2012-04-04 NOTE — Assessment & Plan Note (Signed)
Has been well controlled on current statin dose.

## 2012-04-04 NOTE — Assessment & Plan Note (Signed)
In sinus rhythm today. She continues on Coumadin.

## 2012-04-04 NOTE — Patient Instructions (Addendum)

## 2012-12-01 ENCOUNTER — Other Ambulatory Visit: Payer: Self-pay | Admitting: *Deleted

## 2012-12-01 DIAGNOSIS — I714 Abdominal aortic aneurysm, without rupture: Secondary | ICD-10-CM

## 2012-12-26 ENCOUNTER — Other Ambulatory Visit: Payer: Self-pay | Admitting: Vascular Surgery

## 2012-12-26 ENCOUNTER — Ambulatory Visit
Admission: RE | Admit: 2012-12-26 | Discharge: 2012-12-26 | Disposition: A | Discharge status: Home / Self Care | Payer: Medicare Other | Source: Ambulatory Visit | Attending: Vascular Surgery | Admitting: Vascular Surgery

## 2012-12-26 DIAGNOSIS — I714 Abdominal aortic aneurysm, without rupture: Secondary | ICD-10-CM

## 2012-12-26 LAB — BUN: BUN: 12 mg/dL (ref 6–23)

## 2012-12-26 MED ORDER — IOHEXOL 350 MG/ML SOLN
75.0000 mL | Freq: Once | INTRAVENOUS | Status: AC | PRN
  Administered 2012-12-26: 75 mL via INTRAVENOUS

## 2013-01-01 ENCOUNTER — Encounter: Payer: Self-pay | Admitting: Vascular Surgery

## 2013-01-02 ENCOUNTER — Ambulatory Visit (INDEPENDENT_AMBULATORY_CARE_PROVIDER_SITE_OTHER): Payer: Medicare Other | Admitting: Vascular Surgery

## 2013-01-02 ENCOUNTER — Encounter: Payer: Self-pay | Admitting: Vascular Surgery

## 2013-01-02 VITALS — BP 114/71 | HR 70 | Resp 18 | Ht 60.0 in | Wt 137.0 lb

## 2013-01-02 DIAGNOSIS — I729 Aneurysm of unspecified site: Secondary | ICD-10-CM

## 2013-01-02 DIAGNOSIS — I714 Abdominal aortic aneurysm, without rupture, unspecified: Secondary | ICD-10-CM | POA: Insufficient documentation

## 2013-01-02 HISTORY — DX: Aneurysm of unspecified site: I72.9

## 2013-01-02 NOTE — Progress Notes (Signed)
Patient name: [name] MRN: [id number] DOB: Jan 09, 1928 Sex: female   Referred by: Sherryll Burger  Reason for referral:  Chief Complaint  Patient presents with  . New Evaluation    new AAA referred by Dr Kirstie Peri    HISTORY OF PRESENT ILLNESS: Patient is a for discussion of abdominal aortic aneurysm. This is been followed for a number of years with ultrasound had a recent ultrasound suggesting significant growth. She had a recent CT angiogram for evaluation of this and I have the actual films for review and have reviewed these as well. She is a very debilitated 79 year old who lives with several family members. She is able to walk with a walker and is on continuous oxygen therapy. Does have a history of prior aortic valve replacement 10 years ago. He has no symptoms are aneurysm and this was an incidental finding.  Past Medical History  Diagnosis Date  . COPD (chronic obstructive pulmonary disease)   . Restless leg syndrome   . Essential hypertension, benign   . Coronary atherosclerosis of native coronary artery     Mild nonobstructive 10/12  . Mixed hyperlipidemia   . Iron deficiency   . Lumbosacral disc disease     Myelopathy  . GERD (gastroesophageal reflux disease)   . Legally blind   . Diverticulosis   . Internal hemorrhoids   . TIA (transient ischemic attack) 09/2010  . Aortic stenosis     Status bioprosthetic AVR  . Atrial fibrillation   . Carotid artery disease   . AAA (abdominal aortic aneurysm)     Past Surgical History  Procedure Laterality Date  . Aortic valve replacement  8/05    19 mm Edwards bovine pericardial prosthesis  . Bilateral oophorectomy    . Abdominal hysterectomy    . Cholecystectomy    . Back surgery      History   Social History  . Marital Status: Widowed    Spouse Name: N/A    Number of Children: N/A  . Years of Education: N/A   Occupational History  . Not on file.   Social History Main Topics  . Smoking status: Former  Smoker -- 0.50 packs/day for 65 years    Types: Cigarettes    Quit date: 01/12/2011  . Smokeless tobacco: Never Used     Comment: at last office visit patient said she quit smoking 11/2009  . Alcohol Use: No  . Drug Use: No  . Sexual Activity: Not on file   Other Topics Concern  . Not on file   Social History Narrative  . No narrative on file    Family History  Problem Relation Age of Onset  . Heart disease Mother   . Cancer Father   . AAA (abdominal aortic aneurysm) Father     Allergies as of 01/02/2013 - Review Complete 01/02/2013  Allergen Reaction Noted  . Morphine  06/26/2009    Current Outpatient Prescriptions on File Prior to Visit  Medication Sig Dispense Refill  . budesonide-formoterol (SYMBICORT) 80-4.5 MCG/ACT inhaler Inhale 2 puffs into the lungs 2 (two) times daily.      . citalopram (CELEXA) 20 MG tablet Take 20 mg by mouth daily.      Marland Kitchen diltiazem (CARDIZEM CD) 120 MG 24 hr capsule Take 1 capsule by mouth every morning.       . furosemide (LASIX) 40 MG tablet Take 80 mg by mouth 2 (two) times daily.       Marland Kitchen guaiFENesin (  MUCINEX) 600 MG 12 hr tablet Take 600 mg by mouth 2 (two) times daily.      Marland Kitchen HYDROcodone-acetaminophen (NORCO/VICODIN) 5-325 MG per tablet Take 1 tablet by mouth 4 (four) times daily as needed.      Marland Kitchen ipratropium-albuterol (DUONEB) 0.5-2.5 (3) MG/3ML SOLN Take 3 mLs by nebulization 4 (four) times daily.       . isosorbide mononitrate (IMDUR) 30 MG 24 hr tablet Take 1 tablet by mouth every morning.       . metoCLOPramide (REGLAN) 10 MG tablet Take 1 tablet by mouth 4 (four) times daily.       . nitroGLYCERIN (NITROSTAT) 0.4 MG SL tablet Place 0.4 mg under the tongue every 5 (five) minutes as needed.      Marland Kitchen omeprazole (PRILOSEC) 20 MG capsule Take 1 capsule by mouth 2 (two) times daily.       . potassium chloride SA (K-DUR,KLOR-CON) 20 MEQ tablet Take 1 tablet by mouth 2 (two) times daily.       Marland Kitchen rOPINIRole (REQUIP) 0.5 MG tablet Take 1 tablet  by mouth 3 (three) times daily as needed.       . simvastatin (ZOCOR) 20 MG tablet TAKE ONE TABLET BY MOUTH IN THE EVENING  30 tablet  11  . warfarin (COUMADIN) 5 MG tablet Take 2.5 tablets by mouth every evening. Managed by Dr. Sherryll Burger       No current facility-administered medications on file prior to visit.     REVIEW OF SYSTEMS:  Positives indicated with an "X"  CARDIOVASCULAR:  [ ]  chest pain   [ ]  chest pressure   [ ]  palpitations   [x ] orthopnea   [x ] dyspnea on exertion   [ ]  claudication   [ ]  rest pain   [ ]  DVT   [ ]  phlebitis PULMONARY:   [x ] productive cough   [ ]  asthma   [ x] wheezing NEUROLOGIC:   [x ] weakness  [ ]  paresthesias  [x ] aphasia  [ ]  amaurosis  [x ] dizziness HEMATOLOGIC:   [ ]  bleeding problems   [ ]  clotting disorders MUSCULOSKELETAL:  [ ]  joint pain   [ ]  joint swelling GASTROINTESTINAL: [ ]   blood in stool  [ ]   hematemesis GENITOURINARY:  [ ]   dysuria  [ ]   hematuria PSYCHIATRIC:  [x ] history of major depression INTEGUMENTARY:  [ ]  rashes  [ ]  ulcers CONSTITUTIONAL:  [ ]  fever   [ ]  chills  PHYSICAL EXAMINATION:  General: The patient is a well-nourished female, in no acute distress. Mild shortness of breath Vital signs are BP 114/71  Pulse 70  Resp 18  Ht 5' (1.524 m)  Wt 137 lb (62.143 kg)  BMI 26.76 kg/m2 Pulmonary: There is a good air exchange bilaterally without wheezing or rales. Abdomen: Soft and non-tender with normal pitch bowel sounds. I do not palpate an aneurysm Musculoskeletal: There are no major deformities.  There is no significant extremity pain. Neurologic: No focal weakness or paresthesias are detected, Skin: There are no ulcer or rashes noted. Psychiatric: The patient has normal affect. Cardiovascular: There is a regular rate and rhythm without significant murmur appreciated. Carotid arteries without bruits bilaterally Radial pulses 2+ bilaterally  CT scan of abdomen and pelvis revealed 3.9 cm infrarenal abdominal aortic  aneurysm and a 2.8 cm right common iliac artery aneurysm and a 2.5 cm left common iliac artery aneurysm. There is extensive calcification at chronic changes throughout her  aorta.  Impression and Plan:  Small abdominal aortic aneurysm and moderate iliac artery aneurysms. Very long discussion with the patient and her daughter-in-law present. I am not concerned regarding the infrarenal aorta aneurysm. I am somewhat more concern regarding her iliac artery aneurysms. I feel she would be very difficult stent graft candidate due to the iliac artery aneurysms. I feel that she has a very high risk for even stent graft repair due to her multiple morbidities. I feel that she has a higher risk for treatment and she does for observation. She has been followed with serial studies with Dr.Shah.  There had been some concern with ultrasound showing 4.7 cm aneurysm. She does have tortuosity and I suspect this is the reason for the overcall. I explained that reviewed her specific CT scan almost and to comfortable with 3.9 estimation of her aortic diameter. They would prefer continued followup in [facility] and I feel this is appropriate. I feel that she is not a candidate for open surgical treatment.    Francois [facility] Office: [phone]

## 2013-01-03 ENCOUNTER — Encounter: Payer: Self-pay | Admitting: Cardiology

## 2013-01-18 ENCOUNTER — Encounter: Payer: Self-pay | Admitting: Internal Medicine

## 2013-03-14 ENCOUNTER — Other Ambulatory Visit: Payer: Self-pay | Admitting: Cardiology

## 2015-12-23 ENCOUNTER — Encounter (HOSPITAL_COMMUNITY): Payer: Self-pay | Admitting: General Practice

## 2015-12-23 ENCOUNTER — Inpatient Hospital Stay (HOSPITAL_COMMUNITY)
Admission: AD | Admit: 2015-12-23 | Discharge: 2015-12-26 | DRG: 177 | Disposition: A | Discharge status: Skilled Nursing Facility | Payer: Medicare Other | Source: Other Acute Inpatient Hospital | Attending: Internal Medicine | Admitting: Internal Medicine

## 2015-12-23 ENCOUNTER — Encounter: Payer: Self-pay | Admitting: Internal Medicine

## 2015-12-23 DIAGNOSIS — Z8673 Personal history of transient ischemic attack (TIA), and cerebral infarction without residual deficits: Secondary | ICD-10-CM | POA: Diagnosis not present

## 2015-12-23 DIAGNOSIS — K449 Diaphragmatic hernia without obstruction or gangrene: Secondary | ICD-10-CM

## 2015-12-23 DIAGNOSIS — J690 Pneumonitis due to inhalation of food and vomit: Secondary | ICD-10-CM | POA: Diagnosis not present

## 2015-12-23 DIAGNOSIS — E785 Hyperlipidemia, unspecified: Secondary | ICD-10-CM | POA: Diagnosis present

## 2015-12-23 DIAGNOSIS — M549 Dorsalgia, unspecified: Secondary | ICD-10-CM | POA: Diagnosis present

## 2015-12-23 DIAGNOSIS — Z87442 Personal history of urinary calculi: Secondary | ICD-10-CM | POA: Diagnosis not present

## 2015-12-23 DIAGNOSIS — I480 Paroxysmal atrial fibrillation: Secondary | ICD-10-CM | POA: Diagnosis not present

## 2015-12-23 DIAGNOSIS — Z953 Presence of xenogenic heart valve: Secondary | ICD-10-CM

## 2015-12-23 DIAGNOSIS — Z809 Family history of malignant neoplasm, unspecified: Secondary | ICD-10-CM | POA: Diagnosis not present

## 2015-12-23 DIAGNOSIS — Z79899 Other long term (current) drug therapy: Secondary | ICD-10-CM | POA: Diagnosis not present

## 2015-12-23 DIAGNOSIS — Z9049 Acquired absence of other specified parts of digestive tract: Secondary | ICD-10-CM

## 2015-12-23 DIAGNOSIS — I2510 Atherosclerotic heart disease of native coronary artery without angina pectoris: Secondary | ICD-10-CM | POA: Diagnosis present

## 2015-12-23 DIAGNOSIS — Z9889 Other specified postprocedural states: Secondary | ICD-10-CM

## 2015-12-23 DIAGNOSIS — Z9981 Dependence on supplemental oxygen: Secondary | ICD-10-CM | POA: Diagnosis present

## 2015-12-23 DIAGNOSIS — K219 Gastro-esophageal reflux disease without esophagitis: Secondary | ICD-10-CM | POA: Diagnosis not present

## 2015-12-23 DIAGNOSIS — J9622 Acute and chronic respiratory failure with hypercapnia: Secondary | ICD-10-CM | POA: Diagnosis not present

## 2015-12-23 DIAGNOSIS — Z8249 Family history of ischemic heart disease and other diseases of the circulatory system: Secondary | ICD-10-CM

## 2015-12-23 DIAGNOSIS — G8929 Other chronic pain: Secondary | ICD-10-CM | POA: Diagnosis not present

## 2015-12-23 DIAGNOSIS — N183 Chronic kidney disease, stage 3 (moderate): Secondary | ICD-10-CM | POA: Diagnosis not present

## 2015-12-23 DIAGNOSIS — Z87891 Personal history of nicotine dependence: Secondary | ICD-10-CM | POA: Diagnosis not present

## 2015-12-23 DIAGNOSIS — J9620 Acute and chronic respiratory failure, unspecified whether with hypoxia or hypercapnia: Secondary | ICD-10-CM | POA: Diagnosis not present

## 2015-12-23 DIAGNOSIS — Z7901 Long term (current) use of anticoagulants: Secondary | ICD-10-CM

## 2015-12-23 DIAGNOSIS — Z952 Presence of prosthetic heart valve: Secondary | ICD-10-CM | POA: Diagnosis not present

## 2015-12-23 DIAGNOSIS — Z90710 Acquired absence of both cervix and uterus: Secondary | ICD-10-CM | POA: Diagnosis not present

## 2015-12-23 DIAGNOSIS — H548 Legal blindness, as defined in USA: Secondary | ICD-10-CM | POA: Diagnosis present

## 2015-12-23 DIAGNOSIS — I130 Hypertensive heart and chronic kidney disease with heart failure and stage 1 through stage 4 chronic kidney disease, or unspecified chronic kidney disease: Secondary | ICD-10-CM | POA: Diagnosis not present

## 2015-12-23 DIAGNOSIS — H3530 Unspecified macular degeneration: Secondary | ICD-10-CM | POA: Diagnosis present

## 2015-12-23 DIAGNOSIS — G2581 Restless legs syndrome: Secondary | ICD-10-CM | POA: Diagnosis not present

## 2015-12-23 DIAGNOSIS — R0603 Acute respiratory distress: Secondary | ICD-10-CM

## 2015-12-23 DIAGNOSIS — J9621 Acute and chronic respiratory failure with hypoxia: Secondary | ICD-10-CM | POA: Diagnosis not present

## 2015-12-23 DIAGNOSIS — J449 Chronic obstructive pulmonary disease, unspecified: Secondary | ICD-10-CM | POA: Diagnosis present

## 2015-12-23 DIAGNOSIS — Z885 Allergy status to narcotic agent status: Secondary | ICD-10-CM

## 2015-12-23 HISTORY — DX: Chronic kidney disease, stage 3 unspecified: N18.30

## 2015-12-23 HISTORY — DX: Personal history of other diseases of the digestive system: Z87.19

## 2015-12-23 HISTORY — DX: Serous retinal detachment, unspecified eye: H33.20

## 2015-12-23 HISTORY — DX: Thoracic aortic aneurysm, without rupture: I71.2

## 2015-12-23 HISTORY — DX: Migraine, unspecified, not intractable, without status migrainosus: G43.909

## 2015-12-23 HISTORY — DX: Nonexudative age-related macular degeneration, right eye, stage unspecified: H35.3110

## 2015-12-23 HISTORY — DX: Depression, unspecified: F32.A

## 2015-12-23 HISTORY — DX: Thoracic aortic aneurysm, without rupture, unspecified: I71.20

## 2015-12-23 HISTORY — DX: Unspecified osteoarthritis, unspecified site: M19.90

## 2015-12-23 HISTORY — DX: Major depressive disorder, single episode, unspecified: F32.9

## 2015-12-23 HISTORY — DX: Anxiety disorder, unspecified: F41.9

## 2015-12-23 HISTORY — DX: Unspecified chronic bronchitis: J42

## 2015-12-23 HISTORY — DX: Repeated falls: R29.6

## 2015-12-23 HISTORY — DX: Personal history of urinary calculi: Z87.442

## 2015-12-23 HISTORY — DX: Heart failure, unspecified: I50.9

## 2015-12-23 HISTORY — DX: Aneurysm of unspecified site: I72.9

## 2015-12-23 HISTORY — DX: Other chronic pain: G89.29

## 2015-12-23 HISTORY — DX: Dependence on supplemental oxygen: Z99.81

## 2015-12-23 HISTORY — DX: Pneumonia, unspecified organism: J18.9

## 2015-12-23 HISTORY — DX: Dorsalgia, unspecified: M54.9

## 2015-12-23 HISTORY — DX: Chronic kidney disease, stage 3 (moderate): N18.3

## 2015-12-23 LAB — COMPREHENSIVE METABOLIC PANEL
ALBUMIN: 2.9 g/dL — AB (ref 3.5–5.0)
ALT: 55 U/L — ABNORMAL HIGH (ref 14–54)
AST: 36 U/L (ref 15–41)
Alkaline Phosphatase: 53 U/L (ref 38–126)
Anion gap: 7 (ref 5–15)
BILIRUBIN TOTAL: 0.4 mg/dL (ref 0.3–1.2)
BUN: 32 mg/dL — AB (ref 6–20)
CHLORIDE: 96 mmol/L — AB (ref 101–111)
CO2: 30 mmol/L (ref 22–32)
Calcium: 8.2 mg/dL — ABNORMAL LOW (ref 8.9–10.3)
Creatinine, Ser: 0.8 mg/dL (ref 0.44–1.00)
GFR calc Af Amer: >60 mL/min (ref >60)
GFR calc non Af Amer: >60 mL/min (ref >60)
GLUCOSE: 128 mg/dL — AB (ref 65–99)
POTASSIUM: 4.3 mmol/L (ref 3.5–5.1)
SODIUM: 133 mmol/L — AB (ref 135–145)
TOTAL PROTEIN: 5.3 g/dL — AB (ref 6.5–8.1)

## 2015-12-23 LAB — MRSA PCR SCREENING: MRSA by PCR: NEGATIVE

## 2015-12-23 LAB — CBC WITH DIFFERENTIAL/PLATELET
BASOS ABS: 0 10*3/uL (ref 0.0–0.1)
BASOS PCT: 0 %
EOS ABS: 0 10*3/uL (ref 0.0–0.7)
Eosinophils Relative: 0 %
HEMATOCRIT: 28.9 % — AB (ref 36.0–46.0)
Hemoglobin: 9.2 g/dL — ABNORMAL LOW (ref 12.0–15.0)
Lymphocytes Relative: 3 %
Lymphs Abs: 0.3 10*3/uL — ABNORMAL LOW (ref 0.7–4.0)
MCH: 28.8 pg (ref 26.0–34.0)
MCHC: 31.8 g/dL (ref 30.0–36.0)
MCV: 90.6 fL (ref 78.0–100.0)
MONO ABS: 0.3 10*3/uL (ref 0.1–1.0)
Monocytes Relative: 4 %
NEUTROS ABS: 7.1 10*3/uL (ref 1.7–7.7)
NEUTROS PCT: 92 %
Platelets: 294 10*3/uL (ref 150–400)
RBC: 3.19 MIL/uL — ABNORMAL LOW (ref 3.87–5.11)
RDW: 14.9 % (ref 11.5–15.5)
WBC: 7.7 10*3/uL (ref 4.0–10.5)

## 2015-12-23 LAB — MAGNESIUM: Magnesium: 2.6 mg/dL — ABNORMAL HIGH (ref 1.7–2.4)

## 2015-12-23 LAB — PHOSPHORUS: Phosphorus: 2.9 mg/dL (ref 2.5–4.6)

## 2015-12-23 LAB — STREP PNEUMONIAE URINARY ANTIGEN: Strep Pneumo Urinary Antigen: NEGATIVE

## 2015-12-23 MED ORDER — MOMETASONE FURO-FORMOTEROL FUM 100-5 MCG/ACT IN AERO
2.0000 | INHALATION_SPRAY | Freq: Two times a day (BID) | RESPIRATORY_TRACT | Status: DC
  Administered 2015-12-24 – 2015-12-25 (×3): 2 via RESPIRATORY_TRACT
  Filled 2015-12-23 (×2): qty 8.8

## 2015-12-23 MED ORDER — METOCLOPRAMIDE HCL 10 MG PO TABS
5.0000 mg | ORAL_TABLET | Freq: Three times a day (TID) | ORAL | Status: DC
  Administered 2015-12-23 – 2015-12-26 (×12): 5 mg via ORAL
  Filled 2015-12-23 (×12): qty 1

## 2015-12-23 MED ORDER — NITROGLYCERIN 0.4 MG SL SUBL: 0.4000 mg | SUBLINGUAL_TABLET | SUBLINGUAL | Status: DC | PRN

## 2015-12-23 MED ORDER — GUAIFENESIN ER 600 MG PO TB12
600.0000 mg | ORAL_TABLET | Freq: Two times a day (BID) | ORAL | Status: DC
  Administered 2015-12-23 – 2015-12-26 (×6): 600 mg via ORAL
  Filled 2015-12-23 (×6): qty 1

## 2015-12-23 MED ORDER — POTASSIUM CHLORIDE CRYS ER 20 MEQ PO TBCR
20.0000 meq | EXTENDED_RELEASE_TABLET | Freq: Two times a day (BID) | ORAL | Status: DC
  Administered 2015-12-24 – 2015-12-26 (×5): 20 meq via ORAL
  Filled 2015-12-23 (×6): qty 1

## 2015-12-23 MED ORDER — ISOSORBIDE MONONITRATE ER 30 MG PO TB24
30.0000 mg | ORAL_TABLET | Freq: Every morning | ORAL | Status: DC
  Administered 2015-12-24: 30 mg via ORAL
  Filled 2015-12-23: qty 1

## 2015-12-23 MED ORDER — SIMVASTATIN 40 MG PO TABS
20.0000 mg | ORAL_TABLET | Freq: Every evening | ORAL | Status: DC
  Administered 2015-12-23: 20 mg via ORAL
  Filled 2015-12-23: qty 1

## 2015-12-23 MED ORDER — ROPINIROLE HCL 1 MG PO TABS
0.5000 mg | ORAL_TABLET | Freq: Three times a day (TID) | ORAL | Status: DC
Start: 2015-12-23 — End: 2015-12-26
  Administered 2015-12-23 – 2015-12-26 (×9): 0.5 mg via ORAL
  Filled 2015-12-23 (×9): qty 1

## 2015-12-23 MED ORDER — DEXTROSE 5 % IV SOLN
1.0000 g | Freq: Two times a day (BID) | INTRAVENOUS | Status: DC
  Administered 2015-12-23 – 2015-12-25 (×5): 1 g via INTRAVENOUS
  Filled 2015-12-23 (×8): qty 1

## 2015-12-23 MED ORDER — HYDROCODONE-ACETAMINOPHEN 5-325 MG PO TABS
1.0000 | ORAL_TABLET | Freq: Four times a day (QID) | ORAL | Status: DC | PRN
  Administered 2015-12-23 – 2015-12-26 (×5): 1 via ORAL
  Filled 2015-12-23 (×5): qty 1

## 2015-12-23 MED ORDER — METOCLOPRAMIDE HCL 10 MG PO TABS: 10.0000 mg | ORAL_TABLET | Freq: Three times a day (TID) | ORAL | Status: DC

## 2015-12-23 MED ORDER — DILTIAZEM HCL ER COATED BEADS 120 MG PO CP24
120.0000 mg | ORAL_CAPSULE | Freq: Every day | ORAL | Status: DC
  Administered 2015-12-24 – 2015-12-26 (×3): 120 mg via ORAL
  Filled 2015-12-23 (×3): qty 1

## 2015-12-23 MED ORDER — PANTOPRAZOLE SODIUM 40 MG PO TBEC
40.0000 mg | DELAYED_RELEASE_TABLET | Freq: Every day | ORAL | Status: DC
  Administered 2015-12-24: 40 mg via ORAL
  Filled 2015-12-23: qty 1

## 2015-12-23 MED ORDER — CITALOPRAM HYDROBROMIDE 40 MG PO TABS
40.0000 mg | ORAL_TABLET | Freq: Every day | ORAL | Status: DC
Start: 2015-12-24 — End: 2015-12-26
  Administered 2015-12-24 – 2015-12-26 (×3): 40 mg via ORAL
  Filled 2015-12-23: qty 1
  Filled 2015-12-23 (×2): qty 2

## 2015-12-23 MED ORDER — FUROSEMIDE 80 MG PO TABS
80.0000 mg | ORAL_TABLET | Freq: Two times a day (BID) | ORAL | Status: DC
  Administered 2015-12-24 (×2): 80 mg via ORAL
  Filled 2015-12-23 (×4): qty 1

## 2015-12-23 MED ORDER — IPRATROPIUM-ALBUTEROL 0.5-2.5 (3) MG/3ML IN SOLN
RESPIRATORY_TRACT | Status: AC
  Administered 2015-12-23: 3 mL via RESPIRATORY_TRACT
  Filled 2015-12-23: qty 3

## 2015-12-23 MED ORDER — CEFEPIME HCL 1 G IJ SOLR
1.0000 g | Freq: Three times a day (TID) | INTRAMUSCULAR | Status: DC
  Filled 2015-12-23 (×2): qty 1

## 2015-12-23 MED ORDER — VANCOMYCIN HCL IN DEXTROSE 1-5 GM/200ML-% IV SOLN
1000.0000 mg | INTRAVENOUS | Status: DC
  Administered 2015-12-24 (×2): 1000 mg via INTRAVENOUS
  Filled 2015-12-23 (×3): qty 200

## 2015-12-23 MED ORDER — IPRATROPIUM-ALBUTEROL 0.5-2.5 (3) MG/3ML IN SOLN
3.0000 mL | Freq: Four times a day (QID) | RESPIRATORY_TRACT | Status: DC
  Administered 2015-12-23 – 2015-12-26 (×11): 3 mL via RESPIRATORY_TRACT
  Filled 2015-12-23 (×5): qty 3
  Filled 2015-12-23: qty 36
  Filled 2015-12-23 (×5): qty 3

## 2015-12-23 NOTE — Progress Notes (Signed)
ANTICOAGULATION CONSULT NOTE - Initial Consult  Pharmacy Consult for Coumadin Indication: atrial fibrillation  Allergies  Allergen Reactions  . Morphine Other (See Comments)    HALLUCINATIONS  . Tape Other (See Comments)    SKIN IS VERY THIN AND BRUISES EASILY!!    Patient Measurements: Weight: 150 lb 5.7 oz (68.2 kg)  Vital Signs: Temp: 97.5 F (36.4 C) (11/14 1930) Temp Source: Oral (11/14 1930) BP: 123/54 (11/14 1930) Pulse Rate: 75 (11/14 1853)  Labs: No results for input(s): HGB, HCT, PLT, APTT, LABPROT, INR, HEPARINUNFRC, HEPRLOWMOCWT, CREATININE, CKTOTAL, CKMB, TROPONINI in the last 72 hours.  CrCl cannot be calculated (Patient's most recent lab result is older than the maximum 21 days allowed.).   Medical History: Past Medical History:  Diagnosis Date  . AAA (abdominal aortic aneurysm) (HCC)    also iliac aneurysms(?) and thoracic aneurysm  . AAA (abdominal aortic aneurysm) (HCC)    "watching it til since ~ 2009 w/CT; stopped in 2016 because they didn't want dye due to her kidney function" (12/23/2015)  . Age-related macular degeneration, dry, right eye   . Aneurysm (HCC) 01/02/2013   Small abdominal aortic aneurysm and moderate iliac artery aneurysms/notes11/25/2014   . Anxiety   . Aortic stenosis    Status bioprosthetic AVR  . Arthritis    "all over my body; severely of the spine" (12/23/2015)  . Atrial fibrillation (HCC)   . Carotid artery disease (HCC)   . CHF (congestive heart failure) (HCC)   . Chronic back pain    "all of her back" (12/23/2015)  . Chronic bronchitis (HCC)   . Chronic kidney disease (CKD), stage III (moderate)   . COPD (chronic obstructive pulmonary disease) (HCC)   . Coronary atherosclerosis of native coronary artery    Mild nonobstructive 10/12  . Depression   . Diverticulosis   . Essential hypertension, benign   . Frequent falls 10/2015 X 3  . GERD (gastroesophageal reflux disease)   . History of hiatal hernia   . History of  kidney stones   . Internal hemorrhoids   . Iron deficiency   . Legally blind    "both eyes" (12/23/2015)  . Lumbosacral disc disease    Myelopathy  . Migraine    "stopped in the late 1980s" (12/23/2015)  . Mixed hyperlipidemia   . On home oxygen therapy    "4L 24/7 trying to wean down because of high CO2 before initial hospitalization 11/17/2015" (12/23/2015)  . Pneumonia 03/2015; 11/2015   "she's had pneumonia several times" (12/23/2015)  . Pneumonia 11/2009 X 2   /notes 02/17/2010  . Restless leg syndrome   . Retinal detachment    "left eye; unrepaired" (12/23/2015)  . Thoracic aortic aneurysm (HCC)    "found in 2015" (12/23/2015)  . TIA (transient ischemic attack) 09/2010    Assessment: 82 year old female on Coumadin PTA for Afib INR elevated this AM at 4 at [facility]  Goal of Therapy:  INR 2-3 Monitor platelets by anticoagulation protocol: Yes   Plan:  Hold Coumadin tonight Daily INR  Thank you Okey RegalLisa Kemonie Cutillo, PharmD [phone]  12/23/2015,10:38 PM

## 2015-12-23 NOTE — Progress Notes (Unsigned)
Transfer request per family members. Pt with COPD, ? VCD, PT was in ICU and plan was to transfer to SDU today. Pt is stable for transfer per doctor Vias report. [facility].  Debbora PrestoMAGICK-MYERS, ISKRA, MD  [facility] Pager [phone]  If 7PM-7AM, please contact night-coverage [email or web address] Password [email or web address]

## 2015-12-23 NOTE — Progress Notes (Signed)
Pt. Arrived by transport from [facility]. Vitals taken, doctor paged, CHG wipes used. Admission nurse called.  Skin assessed and showed some small bruises, heels were dry, buttock area no issues noted.

## 2015-12-23 NOTE — H&P (Signed)
History and Physical    [name] ZOX:[id number] DOB: 08/10/1927 DOA: 12/23/2015  PCP: Kirstie PeriSHAH,ASHISH, MD Consultants:  Lovell SheehanJenkins - CT surgery Patient coming from: home - lives with granddaughter; NOK: daughter-in-law Avon Gully(HCPOA), [phone] (home), [phone] (cell)  Chief Complaint: breathing problems  HPI: Darlene NeighborsLoretta G Cochran is a 82 y.o. female with medical history significant of CAD, h/o aortic valve replacement, COPD on 4L home O2, HLD, and PAF on Coumadin presenting as a transfer [facility] for breathing problems.  She reports that on October 9 she was living at home and was admitted to [facility] for CAP. She remained hospitalized until 10/13, including requirement of BIPAP.  Was on 4L home O2 prior to hospitalization.  Was having hallucinations, vivid dreams etc from Labor Day, trying to wean down her O2.  On 10/13, discharged to [facility] for PT/OT, rehab.   Remained there until 11/10, developed respiratory distress.  Trans[facility].  HCAP and CHF exacerbation (intermittently with exacerbations with build up of fluid in abdominal area).  Was hospitalized, given Lasix and volume restriction and also apparently treated with rocephin and azithromycin.  Plan was for discharge back to rehab on Monday AM but Sunday she had acute decompensation and she was sent to the ICU.  Shortly after breakfast, she had an "episode - severe constriction in voice box region."  Did not require additional respiratory support, but was given Solumedrol.  Has been having some episodes of ?vocal cord spasm - difficulty catching her breath, trouble speaking.  Episodes may be associated with eating, result in airway obstruction. Swallow evaluation yesterday was normal as far as family knows.  She was sent here for endoscopic evaluation/bronch?  Does get winded while eating a meal.  Has had h/o esophageal dilation.     Review of Systems: As per HPI; otherwise 10 point  review of systems reviewed and negative.   Ambulatory Status:  Able to ambulate with assistance only; PT got her up with gait belt and walker yesterday [facility] and she couldn't breathe with short distance ambulation  Past Medical History:  Diagnosis Date  . AAA (abdominal aortic aneurysm) (HCC)    also iliac aneurysms(?) and thoracic aneurysm  . AAA (abdominal aortic aneurysm) (HCC)    "watching it til since ~ 2009 w/CT; stopped in 2016 because they didn't want dye due to her kidney function" (12/23/2015)  . Age-related macular degeneration, dry, right eye   . Aneurysm (HCC) 01/02/2013   Small abdominal aortic aneurysm and moderate iliac artery aneurysms/notes11/25/2014   . Anxiety   . Aortic stenosis    Status bioprosthetic AVR  . Arthritis    "all over my body; severely of the spine" (12/23/2015)  . Atrial fibrillation (HCC)   . Carotid artery disease (HCC)   . CHF (congestive heart failure) (HCC)   . Chronic back pain    "all of her back" (12/23/2015)  . Chronic bronchitis (HCC)   . Chronic kidney disease (CKD), stage III (moderate)   . COPD (chronic obstructive pulmonary disease) (HCC)   . Coronary atherosclerosis of native coronary artery    Mild nonobstructive 10/12  . Depression   . Diverticulosis   . Essential hypertension, benign   . Frequent falls 10/2015 X 3  . GERD (gastroesophageal reflux disease)   . History of hiatal hernia   . History of kidney stones   . Internal hemorrhoids   . Iron deficiency   . Legally blind    "both eyes" (  12/23/2015)  . Lumbosacral disc disease    Myelopathy  . Migraine    "stopped in the late 1980s" (12/23/2015)  . Mixed hyperlipidemia   . On home oxygen therapy    "4L 24/7 trying to wean down because of high CO2 before initial hospitalization 11/17/2015" (12/23/2015)  . Pneumonia 03/2015; 11/2015   "she's had pneumonia several times" (12/23/2015)  . Restless leg syndrome   . Retinal detachment    "left eye; unrepaired"  (12/23/2015)  . Thoracic aortic aneurysm (HCC)    "found in 2015" (12/23/2015)  . TIA (transient ischemic attack) 09/2010    Past Surgical History:  Procedure Laterality Date  . AORTIC VALVE REPLACEMENT  09/2003   19 mm Edwards bovine pericardial prosthesis  . APPENDECTOMY    . BACK SURGERY    . CARDIAC CATHETERIZATION  07/2003; 06/2004; 11/2010   Hattie Perch 06/23/2010; 06/23/2010; 11/11/2010  . CARDIAC VALVE REPLACEMENT    . CHOLECYSTECTOMY OPEN    . EYE SURGERY Right    "don't remember why"  . LUMBAR LAMINECTOMY/DECOMPRESSION MICRODISCECTOMY  03/2001   Hattie Perch 06/23/2010;  "unsuccessful"  . MULTIPLE EXTRACTIONS WITH ALVEOLOPLASTY  09/2003   Hattie Perch 06/23/2010  . TONSILLECTOMY    . TOTAL ABDOMINAL HYSTERECTOMY  ~ 1972    Social History   Social History  . Marital status: Widowed    Spouse name: N/A  . Number of children: N/A  . Years of education: N/A   Occupational History  . Not on file.   Social History Main Topics  . Smoking status: Former Smoker    Packs/day: 1.00    Years: 67.00    Types: Cigarettes    Quit date: 2014  . Smokeless tobacco: Never Used  . Alcohol use No  . Drug use: No  . Sexual activity: Not on file   Other Topics Concern  . Not on file   Social History Narrative  . No narrative on file    Allergies  Allergen Reactions  . Morphine Other (See Comments)    HALLUCINATIONS  . Tape Other (See Comments)    SKIN IS VERY THIN AND BRUISES EASILY!!    Family History  Problem Relation Age of Onset  . Heart disease Mother   . Cancer Father   . AAA (abdominal aortic aneurysm) Father     Prior to Admission medications   Medication Sig Start Date End Date Taking? Authorizing Provider  budesonide-formoterol (SYMBICORT) 80-4.5 MCG/ACT inhaler Inhale 2 puffs into the lungs 2 (two) times daily.    Historical Provider, MD  citalopram (CELEXA) 20 MG tablet Take 20 mg by mouth daily.    Historical Provider, MD  diltiazem (CARDIZEM CD) 120 MG 24 hr  capsule Take 1 capsule by mouth every morning.  11/11/10   Historical Provider, MD  furosemide (LASIX) 40 MG tablet Take 80 mg by mouth 2 (two) times daily.  11/12/10   Historical Provider, MD  guaiFENesin (MUCINEX) 600 MG 12 hr tablet Take 600 mg by mouth 2 (two) times daily.    Historical Provider, MD  HYDROcodone-acetaminophen (NORCO/VICODIN) 5-325 MG per tablet Take 1 tablet by mouth 4 (four) times daily as needed for moderate pain.     Historical Provider, MD  ipratropium-albuterol (DUONEB) 0.5-2.5 (3) MG/3ML SOLN Take 3 mLs by nebulization 4 (four) times daily.     Historical Provider, MD  isosorbide mononitrate (IMDUR) 30 MG 24 hr tablet Take 1 tablet by mouth every morning.  10/07/10   Historical Provider, MD  metoCLOPramide (REGLAN) 10  MG tablet Take 1 tablet by mouth 4 (four) times daily.  05/13/11   Historical Provider, MD  nitroGLYCERIN (NITROSTAT) 0.4 MG SL tablet Place 0.4 mg under the tongue every 5 (five) minutes as needed for chest pain.     Historical Provider, MD  omeprazole (PRILOSEC) 20 MG capsule Take 1 capsule by mouth 2 (two) times daily.  10/17/10   Historical Provider, MD  potassium chloride SA (K-DUR,KLOR-CON) 20 MEQ tablet Take 1 tablet by mouth 2 (two) times daily.  01/19/11   Historical Provider, MD  rOPINIRole (REQUIP) 0.5 MG tablet Take 1 tablet by mouth 3 (three) times daily as needed.  11/02/10   Historical Provider, MD  simvastatin (ZOCOR) 20 MG tablet TAKE ONE TABLET BY MOUTH IN THE EVENING 03/14/13   Jonelle SidleSamuel G McDowell, MD  warfarin (COUMADIN) 5 MG tablet Take 2.5 tablets by mouth every evening. Managed by Dr. Sherryll BurgerShah 11/11/10   Historical Provider, MD    Physical Exam: Vitals:   12/23/15 1930 12/23/15 2200 12/23/15 2330 12/24/15 0004  BP: (!) 123/54   (!) 121/42  Pulse:   70   Resp: 13  17 (!) 29  Temp: 97.5 F (36.4 C)   98 F (36.7 C)  TempSrc: Oral   Oral  SpO2: 98%  97% 93%  Weight:  68.2 kg (150 lb 5.7 oz)       General:  Appears calm and comfortable and is  NAD Eyes:  EOMI, normal lids, iris, does not make eye contact  ENT: grossly normal hearing, lips & tongue, mmm Neck:  no LAD, masses or thyromegaly Cardiovascular:  RRR, no m/r/g. No LE edema.  Respiratory: Coarse expiratory breath sounds bilaterally. Increased respiratory effort. Abdomen:  soft, ntnd, NABS Skin:  no rash or induration seen on limited exam Musculoskeletal:  grossly normal tone BUE/BLE, good ROM, no bony abnormality Psychiatric:  grossly normal mood and affect, speech fluent and appropriate, AOx3 Neurologic:  CN 2-12 grossly intact, moves all extremities in coordinated fashion, sensation intact  Labs on Admission: I have personally reviewed following labs and imaging studies  CBC:  Recent Labs Lab 12/23/15 2313  WBC 7.7  NEUTROABS 7.1  HGB 9.2*  HCT 28.9*  MCV 90.6  PLT 294   Basic Metabolic Panel:  Recent Labs Lab 12/23/15 2313  NA 133*  K 4.3  CL 96*  CO2 30  GLUCOSE 128*  BUN 32*  CREATININE 0.80  CALCIUM 8.2*  MG 2.6*  PHOS 2.9   GFR: CrCl cannot be calculated (Unknown ideal weight.). Liver Function Tests:  Recent Labs Lab 12/23/15 2313  AST 36  ALT 55*  ALKPHOS 53  BILITOT 0.4  PROT 5.3*  ALBUMIN 2.9*   No results for input(s): LIPASE, AMYLASE in the last 168 hours. No results for input(s): AMMONIA in the last 168 hours. Coagulation Profile: No results for input(s): INR, PROTIME in the last 168 hours. Cardiac Enzymes: No results for input(s): CKTOTAL, CKMB, CKMBINDEX, TROPONINI in the last 168 hours. BNP (last 3 results) No results for input(s): PROBNP in the last 8760 hours. HbA1C: No results for input(s): HGBA1C in the last 72 hours. CBG: No results for input(s): GLUCAP in the last 168 hours. Lipid Profile: No results for input(s): CHOL, HDL, LDLCALC, TRIG, CHOLHDL, LDLDIRECT in the last 72 hours. Thyroid Function Tests:  Recent Labs  12/23/15 2313  TSH 0.230*   Anemia Panel: No results for input(s): VITAMINB12,  FOLATE, FERRITIN, TIBC, IRON, RETICCTPCT in the last 72 hours. Urine analysis: No  results found for: COLORURINE, APPEARANCEUR, LABSPEC, PHURINE, GLUCOSEU, HGBUR, BILIRUBINUR, KETONESUR, PROTEINUR, UROBILINOGEN, NITRITE, LEUKOCYTESUR  Creatinine Clearance: CrCl cannot be calculated (Unknown ideal weight.).  Sepsis Labs: @LABRCNTIP (procalcitonin:4,lacticidven:4) ) Recent Results (from the past 240 hour(s))  MRSA PCR Screening     Status: None   Collection Time: 12/23/15  7:00 PM  Result Value Ref Range Status   MRSA by PCR NEGATIVE NEGATIVE Final    Comment:        The GeneXpert MRSA Assay (FDA approved for NASAL specimens only), is one component of a comprehensive MRSA colonization surveillance program. It is not intended to diagnose MRSA infection nor to guide or monitor treatment for MRSA infections.      Radiological Exams on Admission: No results found.  EKG: not done  Assessment/Plan Principal Problem:   Acute on chronic respiratory failure (HCC) Active Problems:   H/O aortic valve replacement   Paroxysmal atrial fibrillation (HCC)   COPD (chronic obstructive pulmonary disease) (HCC)   Acute on chronic respiratory failure -Patient with baseline advanced COPD on 4L [place] O2 -They are attempting to wean her home O2 due to hallucinations and vivid dreams, concern for hypercarbia -Patient previously admitted to [facility] and treated for CAP and then sent to rehab -Sent back to [facility] for pneumonia again - which would be HCAP.  However, patient was apparently treated again for CAP.  This may factor in to why she is not improving. -Will start Cefepime and Vanc as per protocol. -Will check for strep pneumo and legionella. -May also have a component of VCD based on family description of episodes; suggest pulmonology consultation in AM. -Duonebs, Albuterol, continue Dulera. -TSH checked and low; will check free T4. -This may also be a COPD exacerbation - have not yet  started steroids but this is also a consideration.  -Will order chest CT without contrast for further evaluation. -Patient previously in ICU at [facility], will monitor in SDU until respiratory status is more stable.  Afib, aortic valve replacement -Continue Coumadin, pharmacy to dose -INR is pending   DVT prophylaxis: Coumadin Code Status: Full - confirmed with patient/family; would not want long-term mechanical ventilation with trach Family Communication: Son and DIL present throughout evaluation Disposition Plan:  Likely back to rehab once clinically improved Consults called: Suggest pulmonology in AM Admission status: Admit - It is my clinical opinion that admission to INPATIENT is reasonable and necessary because this patient will require at least 2 midnights in the hospital to treat this condition based on the medical complexity of the problems presented.  Given the aforementioned information, the predictability of an adverse outcome is felt to be significant.    Jonah Blue MD [facility]  If 7PM-7AM, please contact night-coverage [email or web address] Password [email or web address]  12/24/2015, 2:06 AM

## 2015-12-23 NOTE — Progress Notes (Signed)
Pharmacy Antibiotic Note  Darlene NeighborsLoretta G Cochran is a 82 y.o. female admitted on 12/23/2015 with pneumonia.  Pharmacy has been consulted for Cefepime / Vanocmycin dosing.  [facility]  Plan: Cefepime 1 gram iv Q 12 hours Vancomycin 1 gram iv Q 24 hours Follow up progress, Scr, cultures  Weight: 150 lb 5.7 oz (68.2 kg)  Temp (24hrs), Avg:97.7 F (36.5 C), Min:97.5 F (36.4 C), Max:97.9 F (36.6 C)  No results for input(s): WBC, CREATININE, LATICACIDVEN, VANCOTROUGH, VANCOPEAK, VANCORANDOM, GENTTROUGH, GENTPEAK, GENTRANDOM, TOBRATROUGH, TOBRAPEAK, TOBRARND, AMIKACINPEAK, AMIKACINTROU, AMIKACIN in the last 168 hours.  CrCl cannot be calculated (Patient's most recent lab result is older than the maximum 21 days allowed.).    Allergies  Allergen Reactions  . Morphine Other (See Comments)    HALLUCINATIONS  . Tape Other (See Comments)    SKIN IS VERY THIN AND BRUISES EASILY!!      Thank you for allowing pharmacy to be a part of this patient's care. Okey RegalLisa Ashli Selders, PharmD [phone] 12/23/2015 10:36 PM

## 2015-12-24 ENCOUNTER — Encounter (HOSPITAL_COMMUNITY): Payer: Self-pay | Admitting: Internal Medicine

## 2015-12-24 ENCOUNTER — Inpatient Hospital Stay (HOSPITAL_COMMUNITY): Payer: Medicare Other

## 2015-12-24 DIAGNOSIS — J9620 Acute and chronic respiratory failure, unspecified whether with hypoxia or hypercapnia: Secondary | ICD-10-CM | POA: Diagnosis present

## 2015-12-24 DIAGNOSIS — J9600 Acute respiratory failure, unspecified whether with hypoxia or hypercapnia: Secondary | ICD-10-CM | POA: Insufficient documentation

## 2015-12-24 LAB — BASIC METABOLIC PANEL
Anion gap: 6 (ref 5–15)
BUN: 28 mg/dL — ABNORMAL HIGH (ref 6–20)
CALCIUM: 8.3 mg/dL — AB (ref 8.9–10.3)
CO2: 33 mmol/L — AB (ref 22–32)
CREATININE: 0.75 mg/dL (ref 0.44–1.00)
Chloride: 96 mmol/L — ABNORMAL LOW (ref 101–111)
GFR calc Af Amer: >60 mL/min (ref >60)
GFR calc non Af Amer: >60 mL/min (ref >60)
GLUCOSE: 119 mg/dL — AB (ref 65–99)
Potassium: 4.4 mmol/L (ref 3.5–5.1)
Sodium: 135 mmol/L (ref 135–145)

## 2015-12-24 LAB — TSH: TSH: 0.23 u[IU]/mL — AB (ref 0.350–4.500)

## 2015-12-24 LAB — CBC WITH DIFFERENTIAL/PLATELET
BASOS PCT: 0 %
Basophils Absolute: 0 10*3/uL (ref 0.0–0.1)
EOS ABS: 0 10*3/uL (ref 0.0–0.7)
Eosinophils Relative: 0 %
HEMATOCRIT: 29 % — AB (ref 36.0–46.0)
Hemoglobin: 9.2 g/dL — ABNORMAL LOW (ref 12.0–15.0)
Lymphocytes Relative: 5 %
Lymphs Abs: 0.4 10*3/uL — ABNORMAL LOW (ref 0.7–4.0)
MCH: 29 pg (ref 26.0–34.0)
MCHC: 31.7 g/dL (ref 30.0–36.0)
MCV: 91.5 fL (ref 78.0–100.0)
MONO ABS: 0.5 10*3/uL (ref 0.1–1.0)
MONOS PCT: 6 %
Neutro Abs: 6.8 10*3/uL (ref 1.7–7.7)
Neutrophils Relative %: 89 %
Platelets: 307 10*3/uL (ref 150–400)
RBC: 3.17 MIL/uL — ABNORMAL LOW (ref 3.87–5.11)
RDW: 15 % (ref 11.5–15.5)
WBC: 7.6 10*3/uL (ref 4.0–10.5)

## 2015-12-24 LAB — EXPECTORATED SPUTUM ASSESSMENT W GRAM STAIN, RFLX TO RESP C

## 2015-12-24 LAB — EXPECTORATED SPUTUM ASSESSMENT W REFEX TO RESP CULTURE: SPECIAL REQUESTS: NORMAL

## 2015-12-24 LAB — BRAIN NATRIURETIC PEPTIDE: B Natriuretic Peptide: 426 pg/mL — ABNORMAL HIGH (ref 0.0–100.0)

## 2015-12-24 LAB — PROTIME-INR
INR: 3.47
PROTHROMBIN TIME: 35.7 s — AB (ref 11.4–15.2)

## 2015-12-24 LAB — T4, FREE: FREE T4: 0.87 ng/dL (ref 0.61–1.12)

## 2015-12-24 MED ORDER — ATORVASTATIN CALCIUM 10 MG PO TABS
10.0000 mg | ORAL_TABLET | Freq: Every day | ORAL | Status: DC
  Administered 2015-12-24 – 2015-12-26 (×3): 10 mg via ORAL
  Filled 2015-12-24 (×3): qty 1

## 2015-12-24 MED ORDER — WARFARIN - PHARMACIST DOSING INPATIENT
Freq: Every day | Status: DC
Start: 2015-12-24 — End: 2015-12-26
  Administered 2015-12-24 – 2015-12-25 (×2)

## 2015-12-24 MED ORDER — WARFARIN SODIUM 1 MG PO TABS
1.0000 mg | ORAL_TABLET | Freq: Once | ORAL | Status: AC
  Administered 2015-12-24: 1 mg via ORAL
  Filled 2015-12-24: qty 1

## 2015-12-24 MED ORDER — PANTOPRAZOLE SODIUM 40 MG PO TBEC
40.0000 mg | DELAYED_RELEASE_TABLET | Freq: Two times a day (BID) | ORAL | Status: DC
  Administered 2015-12-24 – 2015-12-26 (×4): 40 mg via ORAL
  Filled 2015-12-24 (×4): qty 1

## 2015-12-24 NOTE — Progress Notes (Signed)
Pt receiving neb at this time,  No distress or complications noted, family at bedside.

## 2015-12-24 NOTE — Progress Notes (Signed)
ANTICOAGULATION CONSULT NOTE - Follow Up Consult  Pharmacy Consult for warfarin Indication: atrial fibrillation  Allergies  Allergen Reactions  . Morphine Other (See Comments)    HALLUCINATIONS  . Tape Other (See Comments)    SKIN IS VERY THIN AND BRUISES EASILY!!    Patient Measurements: Weight: 150 lb 5.7 oz (68.2 kg)  Vital Signs: Temp: 98.9 F (37.2 C) (11/15 0714) Temp Source: Oral (11/15 0714) BP: 130/58 (11/15 0714) Pulse Rate: 66 (11/15 0714)  Labs:  Recent Labs  12/23/15 2313 12/24/15 0336  HGB 9.2* 9.2*  HCT 28.9* 29.0*  PLT 294 307  LABPROT  --  35.7*  INR  --  3.47  CREATININE 0.80 0.75    CrCl cannot be calculated (Unknown ideal weight.).  Assessment: 82 yo f presenting with breathing problems [facility]  PMH: CAD, Aortic Valve replacement, COPD, HLD, AF on warfarin  Anticoag: warfarin pta for Afib, admit INR 3.47  PTA dose = 2.5 mg 5 days per week, 5 mg other 2 days  Nephro: Scr = 0.75  Heme/Onc: H&H 9.2/29, Plt 307  Goal of Therapy:  INR 2-3 Monitor platelets by anticoagulation protocol: Yes   Plan:  Warfarin 1 mg x 1 Daily INR, CBC q72h  Isaac BlissMichael Refoel Palladino, PharmD, BCPS, [facility] Clinical Pharmacist Pager [phone] 12/24/2015 8:23 AM

## 2015-12-24 NOTE — Evaluation (Signed)
Clinical/Bedside Swallow Evaluation Patient Details  Name: [name] Dimartino MRN: [id number] Date of Birth: 11/01/1927  Today's Date: 12/24/2015 Time: SLP Start Time (ACUTE ONLY): 1510 SLP Stop Time (ACUTE ONLY): 1540 SLP Time Calculation (min) (ACUTE ONLY): 30 min  Past Medical History:  Past Medical History:  Diagnosis Date  . AAA (abdominal aortic aneurysm) (HCC)    also iliac aneurysms(?) and thoracic aneurysm  . AAA (abdominal aortic aneurysm) (HCC)    "watching it til since ~ 2009 w/CT; stopped in 2016 because they didn't want dye due to her kidney function" (12/23/2015)  . Age-related macular degeneration, dry, right eye   . Aneurysm (HCC) 01/02/2013   Small abdominal aortic aneurysm and moderate iliac artery aneurysms/notes11/25/2014   . Anxiety   . Aortic stenosis    Status bioprosthetic AVR  . Arthritis    "all over my body; severely of the spine" (12/23/2015)  . Atrial fibrillation (HCC)   . Carotid artery disease (HCC)   . CHF (congestive heart failure) (HCC)   . Chronic back pain    "all of her back" (12/23/2015)  . Chronic bronchitis (HCC)   . Chronic kidney disease (CKD), stage III (moderate)   . COPD (chronic obstructive pulmonary disease) (HCC)   . Coronary atherosclerosis of native coronary artery    Mild nonobstructive 10/12  . Depression   . Diverticulosis   . Essential hypertension, benign   . Frequent falls 10/2015 X 3  . GERD (gastroesophageal reflux disease)   . History of hiatal hernia   . History of kidney stones   . Internal hemorrhoids   . Iron deficiency   . Legally blind    "both eyes" (12/23/2015)  . Lumbosacral disc disease    Myelopathy  . Migraine    "stopped in the late 1980s" (12/23/2015)  . Mixed hyperlipidemia   . On home oxygen therapy    "4L 24/7 trying to wean down because of high CO2 before initial hospitalization 11/17/2015" (12/23/2015)  . Pneumonia 03/2015; 11/2015   "she's had pneumonia several times" (12/23/2015)  .  Restless leg syndrome   . Retinal detachment    "left eye; unrepaired" (12/23/2015)  . Thoracic aortic aneurysm (HCC)    "found in 2015" (12/23/2015)  . TIA (transient ischemic attack) 09/2010   Past Surgical History:  Past Surgical History:  Procedure Laterality Date  . AORTIC VALVE REPLACEMENT  09/2003   19 mm Edwards bovine pericardial prosthesis  . APPENDECTOMY    . BACK SURGERY    . CARDIAC CATHETERIZATION  07/2003; 06/2004; 11/2010   Hattie Perch/notes 06/23/2010; 06/23/2010; 11/11/2010  . CARDIAC VALVE REPLACEMENT    . CHOLECYSTECTOMY OPEN    . EYE SURGERY Right    "don't remember why"  . LUMBAR LAMINECTOMY/DECOMPRESSION MICRODISCECTOMY  03/2001   Hattie Perch/notes 06/23/2010;  "unsuccessful"  . MULTIPLE EXTRACTIONS WITH ALVEOLOPLASTY  09/2003   Hattie Perch/notes 06/23/2010  . TONSILLECTOMY    . TOTAL ABDOMINAL HYSTERECTOMY  ~ 1972   HPI:  [name] is a 82 y.o. female with medical history significant of CAD, h/o aortic valve replacement, COPD on 4L home O2, HLD, and PAF on Coumadin [facility] as a [facility].  She reports that on October 9 she was living at home and was admitted [facility]. She remained hospitalized until 10/13, including requirement of BIPAP.  Was on 4L home O2 prior to hospitalization.  Was having hallucinations, vivid dreams etc from Labor Day, trying to wean down her  O2.  On 10/13, discharged [facility], rehab.   Remained there until 11/10, developed respiratory distress.  Transported [facility].  HCAP and CHF exacerbation (intermittently with exacerbations with build up of fluid in abdominal area).  Was hospitalized, given Lasix and volume restriction and also apparently treated with rocephin and azithromycin.  Plan was for discharge back to rehab on Monday AM but Sunday she had acute decompensation and she was sent to the ICU.  Shortly after breakfast, she had an "episode - severe constriction  in voice box region."  Did not require additional respiratory support, but was given Solumedrol.  Has been having some episodes of ?vocal cord spasm - difficulty catching her breath, trouble speaking.  Episodes may be associated with eating, result in airway obstruction. Swallow evaluation yesterday was normal as far as family knows.  She was sent here for endoscopic evaluation/bronch?  Does get winded while eating a meal.  Has had h/o esophageal dilation.    Assessment / Plan / Recommendation Clinical Impression  Pt didn't display any s/s of orpharyngeal dysphagia while consuming solids and thin liquids via straw. Pt with 4 front teeth in poor condition. Pt with anterior chewing and good oral clearing of solids but recommend dysphagia 3 diet d/t poorly conditioned missing teeth. Pt and nursing agreeable. Skilled ST doesn't appear indicated and ST signing off.     Aspiration Risk  Mild aspiration risk    Diet Recommendation Dysphagia 3 (Mech soft);Thin liquid   Liquid Administration via: Straw;Cup Medication Administration: Whole meds with liquid (As tolerated, crush if needed) Supervision: Staff to assist with self feeding Compensations: Minimize environmental distractions;Slow rate;Small sips/bites;Follow solids with liquid Postural Changes: Seated upright at 90 degrees    Other  Recommendations Oral Care Recommendations: Oral care BID   Follow up Recommendations None       Swallow Study   General Date of Onset: 12/23/15 HPI: [name] Weipert is a 82 y.o. female with medical history significant of CAD, h/o aortic valve replacement, COPD on 4L home O2, HLD, and PAF on Coumadin presenting as a [facility].  She reports that on October 9 she was living at home and was admitted [facility]. She remained hospitalized until 10/13, including requirement of BIPAP.  Was on 4L home O2 prior to hospitalization.  Was having hallucinations,  vivid dreams etc from Labor Day, trying to wean down her O2.  On 10/13, discharged to [facility] for [facility]/OT, rehab.   Remained there until 11/10, developed respiratory distress.  [facility].  HCAP and CHF exacerbation (intermittently with exacerbations with build up of fluid in abdominal area).  Was hospitalized, given Lasix and volume restriction and also apparently treated with rocephin and azithromycin.  Plan was for discharge back to rehab on Monday AM but Sunday she had acute decompensation and she was sent to the ICU.  Shortly after breakfast, she had an "episode - severe constriction in voice box region."  Did not require additional respiratory support, but was given Solumedrol.  Has been having some episodes of ?vocal cord spasm - difficulty catching her breath, trouble speaking.  Episodes may be associated with eating, result in airway obstruction. Swallow evaluation yesterday was normal as far as family knows.  She was sent here for endoscopic evaluation/bronch?  Does get winded while eating a meal.  Has had h/o esophageal dilation.  Type of Study: Bedside Swallow Evaluation  Previous Swallow Assessment:  (None noted in chart) Diet Prior to this Study: NPO Temperature Spikes Noted: No Respiratory Status: Nasal cannula History of Recent Intubation: No Behavior/Cognition: Alert;Cooperative;Pleasant mood Oral Cavity Assessment: Within Functional Limits Oral Care Completed by SLP: No Oral Cavity - Dentition: Poor condition;Missing dentition (4 front teeth) Vision: Impaired for self-feeding Self-Feeding Abilities: Needs assist Patient Positioning: Upright in chair Baseline Vocal Quality: Normal Volitional Cough: Strong Volitional Swallow: Able to elicit    Oral/Motor/Sensory Function Overall Oral Motor/Sensory Function: Within functional limits   Ice Chips Ice chips: Within functional limits Presentation: Self Fed;Spoon   Thin Liquid Thin Liquid: Within  functional limits Presentation: Self Fed;Straw    Nectar Thick Nectar Thick Liquid: Not tested   Honey Thick Honey Thick Liquid: Not tested   Puree Puree: Within functional limits Presentation: Self Fed;Spoon   Solid   GO   Solid: Within functional limits Presentation: Self Fed       Dahna Hattabaugh B. Dreama Saa, M.S., CCC-SLP Speech-Language Pathologist [phone] Ryleigh Esqueda 12/24/2015,3:45 PM

## 2015-12-24 NOTE — Progress Notes (Signed)
SLP Cancellation Note  Patient Details Name: Wende NeighborsLoretta G Renton MRN: [id number] DOB: 06/06/1927   Cancelled treatment:       Reason Eval/Treat Not Completed: Patient at procedure or test/unavailable. Pt is NPO with Esophagram ordered for today. Will f/u for swallow eval when pt ready to take PO.    Codylee Patil, Riley NearingBonnie Caroline 12/24/2015, 8:43 AM

## 2015-12-24 NOTE — Progress Notes (Signed)
Initial Nutrition Assessment  DOCUMENTATION CODES:   Not applicable  INTERVENTION:    Advance diet as medically appropriate, add supplements when/as able  NUTRITION DIAGNOSIS:   Increased nutrient needs related to chronic illness as evidenced by estimated needs  GOAL:   Patient will meet greater than or equal to 90% of their needs  MONITOR:   Diet advancement, PO intake, Labs, Weight trends, I & O's  REASON FOR ASSESSMENT:   Consult COPD Protocol  ASSESSMENT:   82 y.o. Female with medical history significant of CAD, h/o aortic valve replacement, COPD on 4L home O2, HLD, and PAF on Coumadin presenting as a [facility].  Patient not in room at time of visit >> undergoing esophagram. Noted pt has been having vocal cord spasms (?) while eating. Speech Path consulted for swallow evaluation. Nutrient needs increased given COPD. Labs and medications reviewed.  RD unable to complete Nutrition Focused Physical Exam at this time.  Diet Order:  Diet NPO time specified  Skin:  Reviewed, no issues  Last BM:  N/A  Height:   Ht Readings from Last 1 Encounters:  12/24/15 5' (1.524 m)    Weight:   Wt Readings from Last 1 Encounters:  12/24/15 150 lb (68 kg)    Ideal Body Weight:  45.4 kg  BMI:  Body mass index is 29.29 kg/m.  Estimated Nutritional Needs:   Kcal:  1500-1700  Protein:  70-80 gm  Fluid:  1.5-1.7 L  EDUCATION NEEDS:   No education needs identified at this time  Maureen ChattersKatie Whit Bruni, RD, LDN Pager #: [phone] After-Hours Pager #: [phone]

## 2015-12-24 NOTE — Progress Notes (Signed)
PROGRESS NOTE    [name]  RUE:[id number] DOB: 1927/09/06 DOA: 12/23/2015 PCP: Kirstie Peri, MD  Brief Narrative: , and PAF on Coumadin presenting as a transfer from [facility] for breathing problems.  She reports that on October 9 she was living at home and was admitted to [facility] for CAP. She remained hospitalized until 10/13, including requirement of BIPAP.  Was on 4L home O2 prior to hospitalization.  Was having hallucinations, vivid dreams etc from Labor Day, trying to wean down her O2.  On 10/13, discharged to [facility] for PT/OT, rehab.   Remained there until 11/10, developed respiratory distress.  Transported back to [facility] ER.  HCAP and CHF exacerbation (intermittently with exacerbations with build up of fluid in abdominal area).  Was hospitalized, given Lasix and volume restriction and also apparently treated with rocephin and azithromycin.  Plan was for discharge back to rehab on Monday AM but Sunday she had acute decompensation and she was sent to the ICU.  Shortly after breakfast, she had an "episode - severe constriction in voice box region."  Did not require additional respiratory support, but was given Solumedrol.  Has been having some episodes of ?vocal cord spasm - difficulty catching her breath, trouble speaking.  Episodes may be associated with eating, result in airway obstruction  Assessment & Plan:   Principal Problem:   Acute on chronic respiratory failure  -due to Aspiration pneumonia and COPD -has chronic resp failure on 3L Home O2 -continue Current Abx coverage -check Esophagram and SLP evaluation -FU cultures -continue PPI for GERD, change to BID    H/O aortic valve replacement -bovine in 2005    Paroxysmal atrial fibrillation (HCC) -on warfarin     COPD/Chronic Resp failure -stable, nebs PRN  DVT prophylaxis:on warfarin Code Status: Full Code, called POA abd strongly recommended DNR, but patient would like to be FUll  Code Family Communication:called and d/w daughter in law Disposition Plan:Back to SNF     Subjective: Feels ok, breathing better  Objective: Vitals:   12/24/15 0714 12/24/15 0845 12/24/15 1152 12/24/15 1207  BP: (!) 130/58     Pulse: 66     Resp: 15     Temp: 98.9 F (37.2 C)     TempSrc: Oral     SpO2: 98% 98%  96%  Weight:   68 kg (150 lb)   Height:   5\' (1.524 m)     Intake/Output Summary (Last 24 hours) at 12/24/15 1308 Last data filed at 12/24/15 1000  Gross per 24 hour  Intake               50 ml  Output             10 00 ml  Net             -950 ml   Filed Weights   12/23/15 2200 12/24/15 1152  Weight: 68.2 kg (150 lb 5.7 oz) 68 kg (150 lb)    Examination:  General exam: Appears calm and comfortable, AAOx3 Respiratory system: diffuse basilar ronchi Cardiovascular system: S1 & S2 heard, RRR. No JVD, murmurs, rubs, gallops or clicks. No pedal edema. Gastrointestinal system: Abdomen is nondistended, soft and nontender. No organomegaly or masses felt. Normal bowel sounds heard. Central nervous system: Alert and oriented. No focal neurological deficits. Extremities: Symmetric 5 x 5 power. Skin: No rashes, lesions or ulcers Psychiatry: Judgement and insight appear normal. Mood & affect appropriate.     Data Reviewed: I have  personally reviewed following labs and imaging studies  CBC:  Recent Labs Lab 12/23/15 2313 12/24/15 0336  WBC 7.7 7.6  NEUTROABS 7.1 6.8  HGB 9.2* 9.2*  HCT 28.9* 29.0*  MCV 90.6 91.5  PLT 294 307   Basic Metabolic Panel:  Recent Labs Lab 12/23/15 2313 12/24/15 0336  NA 133* 135  K 4.3 4.4  CL 96* 96*  CO2 30 33*  GLUCOSE 128* 119*  BUN 32* 28*  CREATININE 0.80 0.75  CALCIUM 8.2* 8.3*  MG 2.6*  --   PHOS 2.9  --    GFR: Estimated Creatinine Clearance: 43.4 mL/min (by C-G formula based on SCr of 0.75 mg/dL). Liver Function Tests:  Recent Labs Lab 12/23/15 2313  AST 36  ALT 55*  ALKPHOS 53  BILITOT 0.4    PROT 5.3*  ALBUMIN 2.9*   No results for input(s): LIPASE, AMYLASE in the last 168 hours. No results for input(s): AMMONIA in the last 168 hours. Coagulation Profile:  Recent Labs Lab 12/24/15 0336  INR 3.47   Cardiac Enzymes: No results for input(s): CKTOTAL, CKMB, CKMBINDEX, TROPONINI in the last 168 hours. BNP (last 3 results) No results for input(s): PROBNP in the last 8760 hours. HbA1C: No results for input(s): HGBA1C in the last 72 hours. CBG: No results for input(s): GLUCAP in the last 168 hours. Lipid Profile: No results for input(s): CHOL, HDL, LDLCALC, TRIG, CHOLHDL, LDLDIRECT in the last 72 hours. Thyroid Function Tests:  Recent Labs  12/23/15 2313 12/24/15 0336  TSH 0.230*  --   FREET4  --  0.87   Anemia Panel: No results for input(s): VITAMINB12, FOLATE, FERRITIN, TIBC, IRON, RETICCTPCT in the last 72 hours. Urine analysis: No results found for: COLORURINE, APPEARANCEUR, LABSPEC, PHURINE, GLUCOSEU, HGBUR, BILIRUBINUR, KETONESUR, PROTEINUR, UROBILINOGEN, NITRITE, LEUKOCYTESUR Sepsis Labs: @LABRCNTIP (procalcitonin:4,lacticidven:4)  ) Recent Results (from the past 240 hour(s))  MRSA PCR Screening     Status: None   Collection Time: 12/23/15  7:00 PM  Result Value Ref Range Status   MRSA by PCR NEGATIVE NEGATIVE Final    Comment:        The GeneXpert MRSA Assay (FDA approved for NASAL specimens only), is one component of a comprehensive MRSA colonization surveillance program. It is not intended to diagnose MRSA infection nor to guide or monitor treatment for MRSA infections.          Radiology Studies: Ct Chest Wo Contrast  Result Date: 12/24/2015 CLINICAL DATA:  Acute onset of shortness of breath and productive cough. Initial encounter. EXAM: CT CHEST WITHOUT CONTRAST TECHNIQUE: Multidetector CT imaging of the chest was performed following the standard protocol without IV contrast. COMPARISON:  Chest radiograph performed 12/21/2015, and CTA  of the chest performed 11/07/2012 FINDINGS: Cardiovascular: The patient is status post median sternotomy. Diffuse calcification is noted along the thoracic aorta. Diffuse coronary artery calcifications are seen. Mild left atrial enlargement is noted. Scattered calcification is noted along the great vessels. Mediastinum/Nodes: A 1.2 cm precarinal node is noted. No pericardial effusion is identified. The visualized portions of the thyroid gland are unremarkable. No axillary lymphadenopathy is appreciated. Lungs/Pleura: There is opacification of the bronchioles to the right lower lobe, with underlying airspace opacification, compatible with aspiration pneumonia. Mild patchy left basilar opacity may also reflect aspiration pneumonia. Underlying bilateral emphysema is noted. No dominant mass is seen. No definite pleural effusion or pneumothorax is seen. Upper Abdomen: The visualized portions of the liver and spleen are unremarkable. The patient is status post cholecystectomy, with clips  noted along the gallbladder fossa. The visualized portions of the pancreas and left adrenal gland are unremarkable. The visualized portions of the kidneys are grossly unremarkable. A 2.2 cm right adrenal adenoma is noted. A small hiatal hernia is seen. Musculoskeletal: No acute osseous abnormalities are identified. The visualized musculature is unremarkable in appearance. IMPRESSION: 1. Opacification of the bronchioles to the right lower lung lobe, with underlying airspace opacification, compatible with aspiration pneumonia. Mild patchy left basilar opacity may also reflect aspiration pneumonia. 2. Underlying bilateral emphysema noted. 3. 1.2 cm precarinal node noted. 4. Mild left atrial enlargement seen. 5. Diffuse coronary artery calcifications seen. 6. Diffuse thoracic aortic atherosclerosis noted. 7. 2.2 cm right adrenal adenoma noted. 8. Small hiatal hernia seen. Electronically Signed   By: Roanna RaiderJeffery  Chang M.D.   On: 12/24/2015 03:30     Dg Esophagus  Result Date: 12/24/2015 CLINICAL DATA:  Gastroesophageal reflux disease. Difficulty swallowing. EXAM: ESOPHOGRAM/BARIUM SWALLOW WITH BARIUM TABLET TECHNIQUE: Single contrast examination was performed using thin and thick barium and a 13 mm barium tablet. FLUOROSCOPY TIME:  Fluoroscopy Time:  1 minutes 42 seconds COMPARISON:  None. FINDINGS: The oropharyngeal swallowing mechanisms appear normal. There are good primary and secondary stripping waves with no stricture or mass. There is a 5 cm hiatal hernia. The 13 mm barium tablet passed immediately from the mouth into the hiatal hernia without delay. When the patient was placed semi recumbent she had extensive gastroesophageal reflux to the level of the lower cervical spine well above the thoracic inlet. IMPRESSION: 1. 5 cm hiatal hernia with extensive gastroesophageal reflux into the cervical esophagus when the patient was semi recumbent. 2. No stricture or mass.  Normal esophageal motility. Electronically Signed   By: Francene BoyersJames  Maxwell M.D.   On: 12/24/2015 12:10        Scheduled Meds: . atorvastatin  10 mg Oral q1800  . ceFEPime (MAXIPIME) IV  1 g Intravenous Q12H  . citalopram  40 mg Oral Daily  . diltiazem  120 mg Oral Daily  . furosemide  80 mg Oral BID  . guaiFENesin  600 mg Oral BID  . ipratropium-albuterol  3 mL Nebulization QID  . isosorbide mononitrate  30 mg Oral q morning - 10a  . metoCLOPramide  5 mg Oral TID AC & HS  . mometasone-formoterol  2 puff Inhalation BID  . pantoprazole  40 mg Oral Daily  . potassium chloride SA  20 mEq Oral BID  . rOPINIRole  0.5 mg Oral TID  . vancomycin  1,000 mg Intravenous Q24H  . warfarin  1 mg Oral ONCE-1800  . Warfarin - Pharmacist Dosing Inpatient   Does not apply q1800   Continuous Infusions:   LOS: 1 day    Time spent: 35min    Zannie CovePreetha Luanne Krzyzanowski, MD [facility] Pager [phone]  If 7PM-7AM, please contact night-coverage [email or web address] Password  [email or web address] 12/24/2015, 1:08 PM

## 2015-12-24 NOTE — Evaluation (Signed)
Occupational Therapy Evaluation Patient Details Name: [name] MRN: [id number] DOB: 06/01/1927 Today's Date: 12/24/2015    History of Present Illness Pt [facility] with acute on chronic respiratory failure. [facility]. PMH: CAD, aortic valve replacement, CHF, COPD on hom 02, afib, CKD.    Clinical Impression   Pt is dependent in bathing, dressing, some aspects of grooming and all IADL at baseline. She walks with a rollator household distances. Pt presents with generalized weakness and impaired balance with baseline low vision. The plan is for pt to return to SNF for further rehab. Will defer OT to SNF.    Follow Up Recommendations  SNF;Supervision/Assistance - 24 hour    Equipment Recommendations       Recommendations for Other Services       Precautions / Restrictions Precautions Precautions: Fall Precaution Comments: pt with hx of falls per chart      Mobility Bed Mobility Overal bed mobility: Needs Assistance Bed Mobility: Supine to Sit;Sit to Supine     Supine to sit: Min assist Sit to supine: Min assist   General bed mobility comments: HOB up, increased time and use of rail, assist for LEs and to raise trunk  Transfers Overall transfer level: Needs assistance Equipment used: 1 person hand held assist Transfers: Sit to/from Stand Sit to Stand: Min assist         General transfer comment: steadying assist    Balance Overall balance assessment: Needs assistance Sitting-balance support: Feet supported Sitting balance-Leahy Scale: Fair       Standing balance-Leahy Scale: Poor                              ADL Overall ADL's : Needs assistance/impaired Eating/Feeding: NPO (awaiting clearance of swallowing)   Grooming: Wash/dry hands;Wash/dry face;Sitting;Minimal assistance                               Functional mobility during ADLs: Min guard;Rolling  walker General ADL Comments: Pt with baseline dependence in bathing and dressing, NPO pending swallow eval.     Vision     Perception     Praxis      Pertinent Vitals/Pain Pain Assessment: No/denies pain     Hand Dominance Right   Extremity/Trunk Assessment Upper Extremity Assessment Upper Extremity Assessment: RUE deficits/detail;LUE deficits/detail RUE Deficits / Details: generally weak, arthritic changes in hand RUE Coordination: decreased gross motor LUE Deficits / Details: limited shoulder PROM, generalized weakness from elbow to hand, arthritic changes in hand LUE Coordination: decreased gross motor   Lower Extremity Assessment Lower Extremity Assessment: Defer to PT evaluation       Communication Communication Communication: No difficulties   Cognition Arousal/Alertness: Awake/alert Behavior During Therapy: WFL for tasks assessed/performed Overall Cognitive Status: No family/caregiver present to determine baseline cognitive functioning                     General Comments       Exercises       Shoulder Instructions      Home Living Family/patient expects to be discharged to:: Skilled nursing facility Living Arrangements: Other relatives (granddaughter)  Prior Functioning/Environment Level of Independence: Needs assistance  Gait / Transfers Assistance Needed: walks with assistance and use of rollator ADL's / Homemaking Assistance Needed: daughter assists with showering, dressing and grooming. Family does all IADL. Pt can self feed.            OT Problem List: Decreased strength;Decreased activity tolerance;Impaired balance (sitting and/or standing);Impaired vision/perception;Decreased coordination;Decreased cognition;Cardiopulmonary status limiting activity   OT Treatment/Interventions:      OT Goals(Current goals can be found in the care plan section)    OT Frequency:     Barriers to  D/C:            Co-evaluation              End of Session Equipment Utilized During Treatment: Oxygen  Activity Tolerance: Patient tolerated treatment well Patient left: in bed;with call bell/phone within reach (going to a test)   Time: [id number] OT Time Calculation (min): 12 min Charges:  OT General Charges $OT Visit: 1 Procedure OT Evaluation $OT Eval Moderate Complexity: 1 Procedure G-Codes:    [name], [name] 12/24/2015, 11:59 AM  [phone]

## 2015-12-24 NOTE — Progress Notes (Signed)
Patient ambulated in hall on 2L [place] with rolling roller/standby assist. Upon preparing to stand, patient's HR jumped to 130's nonsustained. She ambulated about 4680ft total.  Her saturations remained 96% and above.  Patient was comfortable until after the first 1540ft, at which point she began to endorse dyspnea.  RR was under 20 for the duration, lung sounds slightly wheezy on return to bed.

## 2015-12-24 NOTE — Evaluation (Signed)
Physical Therapy Evaluation Patient Details Name: Darlene NeighborsLoretta G Esterly MRN: [id number] DOB: 12/05/1927 Today's Date: 12/24/2015   History of Present Illness  Pt admitted [facility] with acute on chronic respiratory failure. [facility]. PMH: CAD, aortic valve replacement, CHF, COPD on hom 02, afib, CKD.   Clinical Impression  Pt presented supine in bed with HOB elevated, awake and willing to participate in therapy session. Prior to admission, pt stated that she uses a rollator to ambulate and that her family assists her with ADLs. Pt ambulated 50' with RW and min guard on 2 L of O2 via [place]. Pt's SPO2 maintained at 88-95% throughout ambulation. Pt would continue to benefit from skilled physical therapy services at this time while admitted and after d/c to address her below listed limitations in order to improve her overall safety and independence with functional mobility.     Follow Up Recommendations SNF    Equipment Recommendations  None recommended by PT    Recommendations for Other Services       Precautions / Restrictions Precautions Precautions: Fall Precaution Comments: pt with hx of falls per chart      Mobility  Bed Mobility Overal bed mobility: Needs Assistance Bed Mobility: Supine to Sit     Supine to sit: Min guard;HOB elevated Sit to supine: Min assist   General bed mobility comments: pt required increased time, use of bed rails and min guard for safety  Transfers Overall transfer level: Needs assistance Equipment used: Rolling walker (2 wheeled) Transfers: Sit to/from Stand Sit to Stand: Min guard         General transfer comment: pt required increased time and min guard for safety  Ambulation/Gait Ambulation/Gait assistance: Min guard Ambulation Distance (Feet): 50 Feet Assistive device: Rolling walker (2 wheeled) Gait Pattern/deviations: Step-through pattern Gait velocity: decreased   General Gait  Details: PT gave pt VC'ing for obstacles as pt is visually impaired  Careers information officertairs            Wheelchair Mobility    Modified Rankin (Stroke Patients Only)       Balance Overall balance assessment: Needs assistance;History of Falls Sitting-balance support: Feet supported;No upper extremity supported Sitting balance-Leahy Scale: Fair     Standing balance support: During functional activity;Bilateral upper extremity supported Standing balance-Leahy Scale: Poor Standing balance comment: pt reliant on bilateral UEs on RW                             Pertinent Vitals/Pain Pain Assessment: No/denies pain    Home Living Family/patient expects to be discharged to:: Skilled nursing facility Living Arrangements: Other relatives (granddaughter)                    Prior Function Level of Independence: Needs assistance   Gait / Transfers Assistance Needed: walks with assistance and use of rollator  ADL's / Homemaking Assistance Needed: daughter assists with showering, dressing and grooming. Family does all IADL. Pt can self feed.        Hand Dominance   Dominant Hand: Right    Extremity/Trunk Assessment   Upper Extremity Assessment: Defer to OT evaluation         Communication   Communication: No difficulties  Cognition Arousal/Alertness: Awake/alert Behavior During Therapy: WFL for tasks assessed/performed Overall Cognitive Status: No family/caregiver present to determine baseline cognitive functioning  General Comments      Exercises     Assessment/Plan    PT Assessment Patient needs continued PT services  PT Problem List Decreased strength;Decreased activity tolerance;Decreased balance;Decreased mobility;Decreased coordination;Decreased knowledge of use of DME;Cardiopulmonary status limiting activity          PT Treatment Interventions DME instruction;Gait training;Functional mobility training;Stair  training;Therapeutic activities;Therapeutic exercise;Balance training;Neuromuscular re-education;Patient/family education    PT Goals (Current goals can be found in the Care Plan section)  Acute Rehab PT Goals Patient Stated Goal: to eat (awaiting swallow study) PT Goal Formulation: With patient Time For Goal Achievement: 01/07/16 Potential to Achieve Goals: Fair    Frequency Min 2X/week   Barriers to discharge        Co-evaluation               End of Session Equipment Utilized During Treatment: Gait belt;Oxygen (2 L O2 via [place]) Activity Tolerance: Patient limited by fatigue Patient left: in chair;with call bell/phone within reach;with chair alarm set Nurse Communication: Mobility status         Time: [id number] PT Time Calculation (min) (ACUTE ONLY): 28 min   Charges:   PT Evaluation $PT Eval Moderate Complexity: 1 Procedure PT Treatments $Gait Training: 8-22 mins   PT G CodesAlessandra Bevels:        Collis Thede M Lashanda Storlie 12/24/2015, 2:51 PM Deborah ChalkJennifer Tymeer Vaquera, PT, DPT [phone]

## 2015-12-25 LAB — LEGIONELLA PNEUMOPHILA SEROGP 1 UR AG: L. pneumophila Serogp 1 Ur Ag: NEGATIVE

## 2015-12-25 LAB — CBC
HCT: 30.4 % — ABNORMAL LOW (ref 36.0–46.0)
Hemoglobin: 9.7 g/dL — ABNORMAL LOW (ref 12.0–15.0)
MCH: 29 pg (ref 26.0–34.0)
MCHC: 31.9 g/dL (ref 30.0–36.0)
MCV: 91 fL (ref 78.0–100.0)
PLATELETS: 302 10*3/uL (ref 150–400)
RBC: 3.34 MIL/uL — AB (ref 3.87–5.11)
RDW: 15.2 % (ref 11.5–15.5)
WBC: 7.7 10*3/uL (ref 4.0–10.5)

## 2015-12-25 LAB — BASIC METABOLIC PANEL
ANION GAP: 4 — AB (ref 5–15)
BUN: 33 mg/dL — ABNORMAL HIGH (ref 6–20)
CO2: 37 mmol/L — ABNORMAL HIGH (ref 22–32)
Calcium: 7.9 mg/dL — ABNORMAL LOW (ref 8.9–10.3)
Chloride: 98 mmol/L — ABNORMAL LOW (ref 101–111)
Creatinine, Ser: 1 mg/dL (ref 0.44–1.00)
GFR calc Af Amer: 57 mL/min — ABNORMAL LOW (ref >60)
GFR, EST NON AFRICAN AMERICAN: 50 mL/min — AB (ref >60)
GLUCOSE: 82 mg/dL (ref 65–99)
POTASSIUM: 3.9 mmol/L (ref 3.5–5.1)
SODIUM: 139 mmol/L (ref 135–145)

## 2015-12-25 LAB — PROTIME-INR
INR: 2.36
Prothrombin Time: 26.3 seconds — ABNORMAL HIGH (ref 11.4–15.2)

## 2015-12-25 MED ORDER — FUROSEMIDE 40 MG PO TABS
40.0000 mg | ORAL_TABLET | Freq: Every day | ORAL | Status: DC
  Administered 2015-12-25 – 2015-12-26 (×2): 40 mg via ORAL
  Filled 2015-12-25 (×2): qty 1

## 2015-12-25 MED ORDER — WARFARIN SODIUM 2.5 MG PO TABS
2.5000 mg | ORAL_TABLET | Freq: Once | ORAL | Status: AC
  Administered 2015-12-25: 2.5 mg via ORAL
  Filled 2015-12-25: qty 1

## 2015-12-25 NOTE — Clinical Social Work Note (Signed)
Clinical Social Work Assessment  Patient Details  Name: [name] MRN: [id number] Date of Birth: 09/04/1927  Date of referral:  12/25/15               Reason for consult:  Discharge Planning                Permission sought to share information with:  Facility Medical sales representativeContact Representative, Family Supports Permission granted to share information::  Yes, Verbal Permission Granted  Name::     Beth  Agency::  [facility]  Relationship::  HCPOA  Contact Information:  [phone]  Housing/Transportation Living arrangements for the past 2 months:  Skilled Nursing Facility Source of Information:  Patient, Power of Attorney Patient Interpreter Needed:  None Criminal Activity/Legal Involvement Pertinent to Current Situation/Hospitalization:  No - Comment as needed Significant Relationships:  Other Family Members Lives with:  Facility Resident Do you feel safe going back to the place where you live?  Yes Need for family participation in patient care:  Yes (Comment)  Care giving concerns:  CSW received consult regarding discharge planning. CSW spoke with patient's daughter-in-law, [name]. [facility] and has been in and out of there for months. Patient would like to return to snf at discharge. CSW to continue to follow for discharge needs.    Social Worker assessment / plan:  Patient will return to SNF at discharge by PTAR.  Employment status:  Retired Health and safety inspectornsurance information:  Medicare PT Recommendations:  Skilled Nursing Facility Information / Referral to community resources:  Skilled Nursing Facility  Patient/Family's Response to care:  [name] reports hoping that patient can discharge by Friday because their family is going out of town for Thanksgiving.   Patient/Family's Understanding of and Emotional Response to Diagnosis, Current Treatment, and Prognosis:  Patient/family is realistic regarding therapy needs and expressed being hopeful for SNF placement. Patient  expressed understanding of CSW role and discharge process. No questions/concerns about plan or treatment.    Emotional Assessment Appearance:  Appears stated age Attitude/Demeanor/Rapport:  Other (Appropriate) Affect (typically observed):  Accepting, Appropriate Orientation:  Oriented to Self, Oriented to Situation, Oriented to Place Alcohol / Substance use:  Not Applicable Psych involvement (Current and /or in the community):  No (Comment)  Discharge Needs  Concerns to be addressed:  Care Coordination Readmission within the last 30 days:  No Current discharge risk:  None Barriers to Discharge:  Continued Medical Work up   Ingram [employer] Incadia S Densil Ottey, LCSWA 12/25/2015, 12:35 PM

## 2015-12-25 NOTE — Progress Notes (Signed)
ANTICOAGULATION CONSULT NOTE - Follow Up Consult  Pharmacy Consult for warfarin Indication: atrial fibrillation  Allergies  Allergen Reactions  . Morphine Other (See Comments)    HALLUCINATIONS  . Tape Other (See Comments)    SKIN IS VERY THIN AND BRUISES EASILY!!    Patient Measurements: Height: 5' (152.4 cm) Weight: 150 lb (68 kg) IBW/kg (Calculated) : 45.5  Vital Signs: Temp: 98.2 F (36.8 C) (11/16 0300) Temp Source: Oral (11/16 0300) BP: 102/48 (11/16 0724) Pulse Rate: 64 (11/16 0724)  Labs:  Recent Labs  12/23/15 2313 12/24/15 0336 12/25/15 0347  HGB 9.2* 9.2* 9.7*  HCT 28.9* 29.0* 30.4*  PLT 294 307 302  LABPROT  --  35.7* 26.3*  INR  --  3.47 2.36  CREATININE 0.80 0.75 1.00    Estimated Creatinine Clearance: 34.7 mL/min (by C-G formula based on SCr of 1 mg/dL).  Assessment: 82 yo f presenting with breathing problems [facility]  PMH: CAD, Aortic Valve replacement, COPD, HLD, AF on warfarin  Anticoag: warfarin pta for Afib, admit INR 3.47 INR today 2.36  PTA dose = 2.5 mg 5 days per week, 5 mg other 2 days  Nephro: Scr 1  Heme/Onc: H&H 9.7/30.4, Plt 302  Goal of Therapy:  INR 2-3 Monitor platelets by anticoagulation protocol: Yes   Plan:  Warfarin 2.5 mg x 1 Daily INR, CBC q72h  Isaac BlissMichael Dorlisa Savino, PharmD, BCPS, [facility]CCCP Clinical Pharmacist Pager [phone] 12/25/2015 8:21 AM

## 2015-12-25 NOTE — NC FL2 (Signed)
MEDICAID FL2 LEVEL OF CARE SCREENING TOOL     IDENTIFICATION  Patient Name: [name] Birthdate: 05/29/1927 Sex: female Admission Date (Current Location): 12/23/2015  [facility] [place]:  [place]   Facility and Address:  [facility] [place], [address], [place], [place] [address]      Provider Number: [id number]  Attending Physician Name and Address:  Zannie CovePreetha Joseph, MD  Relative Name and Phone Number:  Beth    Current Level of Care: Hospital Recommended Level of Care: Skilled Nursing Facility Prior Approval Number:    Date Approved/Denied:   PASRR Number: [id number]  Discharge Plan: SNF    Current Diagnoses: Patient Active Problem List   Diagnosis Date Noted  . Acute on chronic respiratory failure (HCC) 12/24/2015  . Acute respiratory failure (HCC) 12/24/2015  . Abdominal aneurysm without mention of rupture 01/02/2013  . Paroxysmal atrial fibrillation (HCC) 12/25/2010  . COPD (chronic obstructive pulmonary disease) (HCC) 12/25/2010  . H/O aortic valve replacement 02/17/2010  . HYPERLIPIDEMIA-MIXED 11/06/2008  . CAD, NATIVE VESSEL 11/06/2008    Orientation RESPIRATION BLADDER Height & Weight     Self, Situation, Place  O2 (Nasal cannula 2L) Continent, Indwelling catheter (urinary catheter) Weight: 68 kg (150 lb) Height:  5' (152.4 cm)  BEHAVIORAL SYMPTOMS/MOOD NEUROLOGICAL BOWEL NUTRITION STATUS      Continent Diet (Please see DC Summary)  AMBULATORY STATUS COMMUNICATION OF NEEDS Skin   Extensive Assist Verbally Normal                       Personal Care Assistance Level of Assistance  Bathing, Feeding, Dressing Bathing Assistance: Maximum assistance Feeding assistance: Independent Dressing Assistance: Limited assistance     Functional Limitations Info  Sight Sight Info: Impaired        SPECIAL CARE FACTORS FREQUENCY  PT (By licensed PT)     PT Frequency: 5x/week               Contractures      Additional Factors Info  Code Status, Allergies Code Status Info: Full Allergies Info: Morphine, Tape           Current Medications (12/25/2015):  This is the current hospital active medication list Current Facility-Administered Medications  Medication Dose Route Frequency Provider Last Rate Last Dose  . atorvastatin (LIPITOR) tablet 10 mg  10 mg Oral q1800 Zannie CovePreetha Joseph, MD   10 mg at 12/24/15 1751  . ceFEPIme (MAXIPIME) 1 g in dextrose 5 % 50 mL IVPB  1 g Intravenous Q12H Dorothea OgleIskra M Myers, MD   1 g at 12/25/15 0834  . citalopram (CELEXA) tablet 40 mg  40 mg Oral Daily Jonah BlueJennifer Yates, MD   40 mg at 12/25/15 [phone]  . diltiazem (CARDIZEM CD) 24 hr capsule 120 mg  120 mg Oral Daily Jonah BlueJennifer Yates, MD   120 mg at 12/25/15 [id number]  . furosemide (LASIX) tablet 40 mg  40 mg Oral Daily Zannie CovePreetha Joseph, MD   40 mg at 12/25/15 0834  . guaiFENesin (MUCINEX) 12 hr tablet 600 mg  600 mg Oral BID Jonah BlueJennifer Yates, MD   600 mg at 12/25/15 [id number]  . HYDROcodone-acetaminophen (NORCO/VICODIN) 5-325 MG per tablet 1 tablet  1 tablet Oral QID PRN Jonah BlueJennifer Yates, MD   1 tablet at 12/24/15 2206  . ipratropium-albuterol (DUONEB) 0.5-2.5 (3) MG/3ML nebulizer solution 3 mL  3 mL Nebulization QID Jonah BlueJennifer Yates, MD   3 mL at 12/25/15 1135  . metoCLOPramide (REGLAN)  tablet 5 mg  5 mg Oral TID AC & HS Dorothea OgleIskra M Myers, MD   5 mg at 12/25/15 1133  . mometasone-formoterol (DULERA) 100-5 MCG/ACT inhaler 2 puff  2 puff Inhalation BID Jonah BlueJennifer Yates, MD   2 puff at 12/25/15 0724  . nitroGLYCERIN (NITROSTAT) SL tablet 0.4 mg  0.4 mg Sublingual Q5 min PRN Jonah BlueJennifer Yates, MD      . pantoprazole (PROTONIX) EC tablet 40 mg  40 mg Oral BID Zannie CovePreetha Joseph, MD   40 mg at 12/25/15 [id number]  . potassium chloride SA (K-DUR,KLOR-CON) CR tablet 20 mEq  20 mEq Oral BID Jonah BlueJennifer Yates, MD   20 mEq at 12/25/15 [phone]  . rOPINIRole (REQUIP) tablet 0.5 mg  0.5 mg Oral TID Jonah BlueJennifer Yates, MD   0.5 mg at 12/25/15 [id number]  . warfarin  (COUMADIN) tablet 2.5 mg  2.5 mg Oral ONCE-1800 Bertram MillardMichael A Maccia, RPH      . Warfarin - Pharmacist Dosing Inpatient   Does not apply q1800 Bertram MillardMichael A Maccia, [facility] CenterRPH         Discharge Medications: Please see discharge summary for a list of discharge medications.  Relevant Imaging Results:  Relevant Lab Results:   Additional Information SSN: [id number]94  Soriya Worster S IraanRayyan, [place]WA

## 2015-12-25 NOTE — Progress Notes (Signed)
Report attempted x 1

## 2015-12-25 NOTE — Care Management Note (Signed)
Case Management Note  Patient Details  Name: [name] MRN: [id number] Date of Birth: 07/11/1927  Subjective/Objective:  [facility].  Per pt, she was @ [place] for [place]hab and plans to return there upon discharge.  Pt states she was living alone before admission to hospital but her daughter stays with her during the day and her granddaughter spends the night @ her house.                             Expected Discharge Plan:  Skilled Nursing Facility  In-House Referral:  Clinical Social Work  Discharge planning Services  CM Consult  Status of Service:  Completed, signed off  Magdalene RiverMayo, Daveda Larock T, CaliforniaRN 12/25/2015, 12:17 PM

## 2015-12-25 NOTE — Progress Notes (Signed)
PROGRESS NOTE    [name]  ZOX:[id number] DOB: 12/08/1927 DOA: 12/23/2015 PCP: Kirstie Peri, MD  Brief Narrative: , and PAF on Coumadin presenting as a transfer from [facility] for breathing problems.  She reports that on October 9 she was living at home and was admitted to [facility] for CAP. She remained hospitalized until 10/13, including requirement of BIPAP.  Was on 4L home O2 prior to hospitalization.  Was having hallucinations, vivid dreams etc from Labor Day, trying to wean down her O2.  On 10/13, discharged to [facility] for PT/OT, rehab.   Remained there until 11/10, developed respiratory distress.  Transported back to [facility].  HCAP and CHF exacerbation (intermittently with exacerbations with build up of fluid in abdominal area).  Was hospitalized, given Lasix and volume restriction and also apparently treated with rocephin and azithromycin.  Plan was for discharge back to rehab on Monday AM but Sunday she had acute decompensation and she was sent to the ICU.  Shortly after breakfast, she had an "episode - severe constriction in voice box region."  Did not require additional respiratory support, but was given Solumedrol.  Has been having some episodes of ?vocal cord spasm - difficulty catching her breath, trouble speaking.  Episodes may be associated with eating, result in airway obstruction  Assessment & Plan:     Acute on chronic respiratory failure  -due to Aspiration pneumonia and COPD -has chronic resp failure on 3L Home O2 -continue Cefepime, change to augmentin tomorrow, stopped Vancomycin -Esophagram with large hiatal hernia and GERD, no mass or stricture and normal motility -s/p SLP evaluation, mild aspiration risk, D3 diet with thin liquids recommended -Resp cultures-polymicrobial, re incubated, blood  -continue PPI for GERD, changed to BID -d/w with daughter in law and part PoA Beth that due to advanced age, GERD, hiatal hernia and  Dyshagia pt will be a aspiration risk long term    H/O aortic valve replacement -bovine in 2005    Paroxysmal atrial fibrillation (HCC) -on warfarin     COPD/Chronic Resp failure -stable, nebs PRN  DVT prophylaxis:on warfarin Code Status: Full Code, called POA abd strongly recommended DNR, but patient would like to be FUll Code  Family Communication: no family at bedside, called and d/w daughter in law Beth 11/15 Disposition Plan:Back to SNF in 1-2days     Subjective: Feels ok, breathing better  Objective: Vitals:   12/25/15 0724 12/25/15 0833 12/25/15 1135 12/25/15 1155  BP: (!) 102/48 (!) 104/50  (!) 100/54  Pulse: 64 71 67 71  Resp: 12 18 14 18  Temp:  97.9 F (36.6 C)  97.7 F (36.5 C)  TempSrc:  Oral  Oral  SpO2: 99% 95% 96% 96%  Weight:      Height:        Intake/Output Summary (Last 24 hours) at 12/25/15 1402 Last data filed at 12/25/15 0928  Gross per 24 hour  Intake              560 ml  Output             22 25 ml  Net            -1665 ml   Filed Weights   12/23/15 2200 12/24/15 1152  Weight: 68.2 kg (150 lb 5.7 oz) 68 kg (150 lb)    Examination:  General exam: Appears calm and comfortable, AAOx3 Respiratory system: diffuse basilar ronchi Cardiovascular system: S1 & S2 heard, RRR. No JVD, murmurs, rubs, gallops  or clicks. No pedal edema. Gastrointestinal system: Abdomen is nondistended, soft and nontender. No organomegaly or masses felt. Normal bowel sounds heard. Central nervous system: Alert and oriented. No focal neurological deficits. Extremities: Symmetric 5 x 5 power. Skin: No rashes, lesions or ulcers Psychiatry: Judgement and insight appear normal. Mood & affect appropriate.     Data Reviewed: I have personally reviewed following labs and imaging studies  CBC:  Recent Labs Lab 12/23/15 2313 12/24/15 0336 12/25/15 0347  WBC 7.7 7.6 7.7  NEUTROABS 7.1 6.8  --   HGB 9.2* 9.2* 9.7*  HCT 28.9* 29.0* 30.4*  MCV 90.6 91.5 91.0  PLT  294 307 302   Basic Metabolic Panel:  Recent Labs Lab 12/23/15 2313 12/24/15 0336 12/25/15 0347  NA 133* 135 139  K 4.3 4.4 3.9  CL 96* 96* 98*  CO2 30 33* 37*  GLUCOSE 128* 119* 82  BUN 32* 28* 33*  CREATININE 0.80 0.75 1.00  CALCIUM 8.2* 8.3* 7.9*  MG 2.6*  --   --   PHOS 2.9  --   --    GFR: Estimated Creatinine Clearance: 34.7 mL/min (by C-G formula based on SCr of 1 mg/dL). Liver Function Tests:  Recent Labs Lab 12/23/15 2313  AST 36  ALT 55*  ALKPHOS 53  BILITOT 0.4  PROT 5.3*  ALBUMIN 2.9*   No results for input(s): LIPASE, AMYLASE in the last 168 hours. No results for input(s): AMMONIA in the last 168 hours. Coagulation Profile:  Recent Labs Lab 12/24/15 0336 12/25/15 0347  INR 3.47 2.36   Cardiac Enzymes: No results for input(s): CKTOTAL, CKMB, CKMBINDEX, TROPONINI in the last 168 hours. BNP (last 3 results) No results for input(s): PROBNP in the last 8760 hours. HbA1C: No results for input(s): HGBA1C in the last 72 hours. CBG: No results for input(s): GLUCAP in the last 168 hours. Lipid Profile: No results for input(s): CHOL, HDL, LDLCALC, TRIG, CHOLHDL, LDLDIRECT in the last 72 hours. Thyroid Function Tests:  Recent Labs  12/23/15 2313 12/24/15 0336  TSH 0.230*  --   FREET4  --  0.87   Anemia Panel: No results for input(s): VITAMINB12, FOLATE, FERRITIN, TIBC, IRON, RETICCTPCT in the last 72 hours. Urine analysis: No results found for: COLORURINE, APPEARANCEUR, LABSPEC, PHURINE, GLUCOSEU, HGBUR, BILIRUBINUR, KETONESUR, PROTEINUR, UROBILINOGEN, NITRITE, LEUKOCYTESUR Sepsis Labs: @LABRCNTIP (procalcitonin:4,lacticidven:4)  ) Recent Results (from the past 240 hour(s))  MRSA PCR Screening     Status: None   Collection Time: 12/23/15  7:00 PM  Result Value Ref Range Status   MRSA by PCR NEGATIVE NEGATIVE Final    Comment:        The GeneXpert MRSA Assay (FDA approved for NASAL specimens only), is one component of a comprehensive MRSA  colonization surveillance program. It is not intended to diagnose MRSA infection nor to guide or monitor treatment for MRSA infections.   Culture, blood (routine x 2) Call MD if unable to obtain prior to antibiotics being given     Status: None (Preliminary result)   Collection Time: 12/23/15 11:05 PM  Result Value Ref Range Status   Specimen Description BLOOD RIGHT ARM  Final   Special Requests IN PEDIATRIC BOTTLE  Final   Culture NO GROWTH < 24 HOURS  Final   Report Status PENDING  Incomplete  Culture, blood (routine x 2) Call MD if unable to obtain prior to antibiotics being given     Status: None (Preliminary result)   Collection Time: 12/23/15 11:12 PM  Result  Value Ref Range Status   Specimen Description BLOOD RIGHT HAND  Final   Special Requests IN PEDIATRIC BOTTLE 2ML  Final   Culture NO GROWTH < 24 HOURS  Final   Report Status PENDING  Incomplete  Culture, sputum-assessment     Status: None   Collection Time: 12/24/15  9:30 AM  Result Value Ref Range Status   Specimen Description EXPECTORATED SPUTUM  Final   Special Requests Normal  Final   Sputum evaluation   Final    THIS SPECIMEN IS ACCEPTABLE. RESPIRATORY CULTURE REPORT TO FOLLOW.   Report Status 12/24/2015 FINAL  Final  Culture, respiratory (NON-Expectorated)     Status: None (Preliminary result)   Collection Time: 12/24/15  9:30 AM  Result Value Ref Range Status   Specimen Description EXPECTORATED SPUTUM  Final   Special Requests NONE  Final   Gram Stain   Final    FEW WBC PRESENT, PREDOMINANTLY PMN ABUNDANT GRAM POSITIVE RODS FEW GRAM POSITIVE COCCI IN CLUSTERS FEW GRAM NEGATIVE COCCOBACILLI    Culture CULTURE REINCUBATED FOR BETTER GROWTH  Final   Report Status PENDING  Incomplete         Radiology Studies: Ct Chest Wo Contrast  Result Date: 12/24/2015 CLINICAL DATA:  Acute onset of shortness of breath and productive cough. Initial encounter. EXAM: CT CHEST WITHOUT CONTRAST TECHNIQUE:  Multidetector CT imaging of the chest was performed following the standard protocol without IV contrast. COMPARISON:  Chest radiograph performed 12/21/2015, and CTA of the chest performed 11/07/2012 FINDINGS: Cardiovascular: The patient is status post median sternotomy. Diffuse calcification is noted along the thoracic aorta. Diffuse coronary artery calcifications are seen. Mild left atrial enlargement is noted. Scattered calcification is noted along the great vessels. Mediastinum/Nodes: A 1.2 cm precarinal node is noted. No pericardial effusion is identified. The visualized portions of the thyroid gland are unremarkable. No axillary lymphadenopathy is appreciated. Lungs/Pleura: There is opacification of the bronchioles to the right lower lobe, with underlying airspace opacification, compatible with aspiration pneumonia. Mild patchy left basilar opacity may also reflect aspiration pneumonia. Underlying bilateral emphysema is noted. No dominant mass is seen. No definite pleural effusion or pneumothorax is seen. Upper Abdomen: The visualized portions of the liver and spleen are unremarkable. The patient is status post cholecystectomy, with clips noted along the gallbladder fossa. The visualized portions of the pancreas and left adrenal gland are unremarkable. The visualized portions of the kidneys are grossly unremarkable. A 2.2 cm right adrenal adenoma is noted. A small hiatal hernia is seen. Musculoskeletal: No acute osseous abnormalities are identified. The visualized musculature is unremarkable in appearance. IMPRESSION: 1. Opacification of the bronchioles to the right lower lung lobe, with underlying airspace opacification, compatible with aspiration pneumonia. Mild patchy left basilar opacity may also reflect aspiration pneumonia. 2. Underlying bilateral emphysema noted. 3. 1.2 cm precarinal node noted. 4. Mild left atrial enlargement seen. 5. Diffuse coronary artery calcifications seen. 6. Diffuse thoracic  aortic atherosclerosis noted. 7. 2.2 cm right adrenal adenoma noted. 8. Small hiatal hernia seen. Electronically Signed   By: Roanna RaiderJeffery  Chang M.D.   On: 12/24/2015 03:30   Dg Esophagus  Result Date: 12/24/2015 CLINICAL DATA:  Gastroesophageal reflux disease. Difficulty swallowing. EXAM: ESOPHOGRAM/BARIUM SWALLOW WITH BARIUM TABLET TECHNIQUE: Single contrast examination was performed using thin and thick barium and a 13 mm barium tablet. FLUOROSCOPY TIME:  Fluoroscopy Time:  1 minutes 42 seconds COMPARISON:  None. FINDINGS: The oropharyngeal swallowing mechanisms appear normal. There are good primary and secondary stripping waves with  no stricture or mass. There is a 5 cm hiatal hernia. The 13 mm barium tablet passed immediately from the mouth into the hiatal hernia without delay. When the patient was placed semi recumbent she had extensive gastroesophageal reflux to the level of the lower cervical spine well above the thoracic inlet. IMPRESSION: 1. 5 cm hiatal hernia with extensive gastroesophageal reflux into the cervical esophagus when the patient was semi recumbent. 2. No stricture or mass.  Normal esophageal motility. Electronically Signed   By: Francene BoyersJames  Maxwell M.D.   On: 12/24/2015 12:10        Scheduled Meds: . atorvastatin  10 mg Oral q1800  . ceFEPime (MAXIPIME) IV  1 g Intravenous Q12H  . citalopram  40 mg Oral Daily  . diltiazem  120 mg Oral Daily  . furosemide  40 mg Oral Daily  . guaiFENesin  600 mg Oral BID  . ipratropium-albuterol  3 mL Nebulization QID  . metoCLOPramide  5 mg Oral TID AC & HS  . mometasone-formoterol  2 puff Inhalation BID  . pantoprazole  40 mg Oral BID  . potassium chloride SA  20 mEq Oral BID  . rOPINIRole  0.5 mg Oral TID  . warfarin  2.5 mg Oral ONCE-1800  . Warfarin - Pharmacist Dosing Inpatient   Does not apply q1800   Continuous Infusions:   LOS: 2 days    Time spent: 35min    Zannie CovePreetha Jama Krichbaum, MD [facility] Pager [phone]  If  7PM-7AM, please contact night-coverage [email or web address] Password [email or web address] 12/25/2015, 2:02 PM

## 2015-12-26 DIAGNOSIS — J690 Pneumonitis due to inhalation of food and vomit: Secondary | ICD-10-CM | POA: Diagnosis present

## 2015-12-26 DIAGNOSIS — J9621 Acute and chronic respiratory failure with hypoxia: Secondary | ICD-10-CM

## 2015-12-26 DIAGNOSIS — J9622 Acute and chronic respiratory failure with hypercapnia: Secondary | ICD-10-CM

## 2015-12-26 DIAGNOSIS — K449 Diaphragmatic hernia without obstruction or gangrene: Secondary | ICD-10-CM

## 2015-12-26 DIAGNOSIS — K219 Gastro-esophageal reflux disease without esophagitis: Secondary | ICD-10-CM

## 2015-12-26 LAB — PROTIME-INR
INR: 1.86
PROTHROMBIN TIME: 21.7 s — AB (ref 11.4–15.2)

## 2015-12-26 LAB — CULTURE, RESPIRATORY W GRAM STAIN: Culture: NORMAL

## 2015-12-26 LAB — CULTURE, RESPIRATORY

## 2015-12-26 MED ORDER — PANTOPRAZOLE SODIUM 40 MG PO TBEC
40.0000 mg | DELAYED_RELEASE_TABLET | Freq: Two times a day (BID) | ORAL | Status: DC
  Administered 2015-12-26: 40 mg via ORAL
  Filled 2015-12-26: qty 1

## 2015-12-26 MED ORDER — AMOXICILLIN-POT CLAVULANATE 500-125 MG PO TABS
1.0000 | ORAL_TABLET | Freq: Three times a day (TID) | ORAL | Status: DC
  Administered 2015-12-26 (×2): 500 mg via ORAL
  Filled 2015-12-26 (×4): qty 1

## 2015-12-26 MED ORDER — AMOXICILLIN-POT CLAVULANATE 500-125 MG PO TABS: 1.0000 | ORAL_TABLET | Freq: Three times a day (TID) | ORAL | 0 refills | Status: AC

## 2015-12-26 MED ORDER — METOCLOPRAMIDE HCL 10 MG PO TABS: 5.0000 mg | ORAL_TABLET | Freq: Four times a day (QID) | ORAL | 0 refills | Status: AC

## 2015-12-26 MED ORDER — WARFARIN SODIUM 5 MG PO TABS
5.0000 mg | ORAL_TABLET | Freq: Once | ORAL | Status: AC
  Administered 2015-12-26: 5 mg via ORAL
  Filled 2015-12-26: qty 1

## 2015-12-26 MED ORDER — IPRATROPIUM-ALBUTEROL 0.5-2.5 (3) MG/3ML IN SOLN: 3.0000 mL | Freq: Three times a day (TID) | RESPIRATORY_TRACT | Status: DC

## 2015-12-26 MED ORDER — FUROSEMIDE 80 MG PO TABS: 40.0000 mg | ORAL_TABLET | Freq: Two times a day (BID) | ORAL | 0 refills | Status: AC

## 2015-12-26 MED ORDER — ATORVASTATIN CALCIUM 10 MG PO TABS: 10.0000 mg | ORAL_TABLET | Freq: Every day | ORAL | 0 refills | Status: AC

## 2015-12-26 MED ORDER — HYDROCODONE-ACETAMINOPHEN 5-325 MG PO TABS: 1.0000 | ORAL_TABLET | Freq: Three times a day (TID) | ORAL | 0 refills | Status: AC | PRN

## 2015-12-26 NOTE — Progress Notes (Signed)
Physical Therapy Treatment Patient Details Name: [name] MRN: [id number] DOB: 08/23/1927 Today's Date: 12/26/2015    History of Present Illness Pt admitted from [facility] with acute on chronic respiratory failure. [facility] to admission. PMH: CAD, aortic valve replacement, CHF, COPD on hom 02, afib, CKD.     PT Comments    Pt performed increased mobility but limited due to BM incontinence and DOE/fatigue.  Pt to d/c to SNF to continue rehab before returning home.    Follow Up Recommendations  SNF     Equipment Recommendations  None recommended by PT    Recommendations for Other Services       Precautions / Restrictions Precautions Precautions: Fall Precaution Comments: pt with hx of falls per chart Restrictions Weight Bearing Restrictions: No    Mobility  Bed Mobility Overal bed mobility: Needs Assistance Bed Mobility: Supine to Sit     Supine to sit: HOB elevated;Supervision Sit to supine: Min assist   General bed mobility comments: pt able to perform supine to sit with out assist and HOB elevated.  To return to supine patient required min to min guard assist to lift B LEs into bed against gravity.    Transfers Overall transfer level: Needs assistance Equipment used: Rolling walker (2 wheeled) Transfers: Sit to/from Stand Sit to Stand: Min guard;Min assist         General transfer comment: Pt performed sit to stand min guard from bed and min assist from commode and chair without arm rests.  Pt stood at sink for perianal care.    Ambulation/Gait Ambulation/Gait assistance: Min guard Ambulation Distance (Feet): 110 Feet Assistive device: Rolling walker (2 wheeled) Gait Pattern/deviations: Step-through pattern;Trunk flexed;Decreased stride length Gait velocity: decreased   General Gait Details: Cues for RW placement and cues for upper trunk control.  Pt began to have BM in hall in large quantities.  This inconitnence  limited her gait distance.  Pt also with productive cough post tx.     Stairs            Wheelchair Mobility    Modified Rankin (Stroke Patients Only)       Balance Overall balance assessment: Needs assistance   Sitting balance-Leahy Scale: Fair       Standing balance-Leahy Scale: Fair Standing balance comment: Pt performed increased mobility, including standing at sink for perianal care.                      Cognition Arousal/Alertness: Awake/alert Behavior During Therapy: WFL for tasks assessed/performed Overall Cognitive Status: No family/caregiver present to determine baseline cognitive functioning                      Exercises      General Comments        Pertinent Vitals/Pain Pain Assessment: No/denies pain    Home Living                      Prior Function            PT Goals (current goals can now be found in the care plan section) Acute Rehab PT Goals Patient Stated Goal: to get to rehab Potential to Achieve Goals: Fair Progress towards PT goals: Progressing toward goals    Frequency    Min 2X/week      PT Plan Current plan remains appropriate    Co-evaluation  End of Session Equipment Utilized During Treatment: Gait belt;Oxygen (2L [place]) Activity Tolerance: Patient limited by fatigue Patient left: in chair;with call bell/phone within reach;with chair alarm set     Time: 1443-1520 PT Time Calculation (min) (ACUTE ONLY): 37 min  Charges:  $Gait Training: 8-22 mins $Therapeutic Activity: 8-22 mins                    G Codes:      Florestine Aversimee J Vahe Pienta 12/26/2015, 3:47 PM  Joycelyn RuaAimee Elonna Mcfarlane, PTA pager [phone]

## 2015-12-26 NOTE — Progress Notes (Signed)
Speech Language Pathology Treatment: Dysphagia  Patient Details Name: [name] Matura MRN: [id number] DOB: 06/05/1927 Today's Date: 12/26/2015 Time: [id number] SLP Time Calculation (min) (ACUTE ONLY): 15 min  Assessment / Plan / Recommendation Clinical Impression  New orders received for swallow evaluation given performance on esophagram, which revealed extensive gastroesophageal reflux into the cervical esophagus.  Initial clinical swallow evaluation completed 11/15, with findings of normal oropharyngeal swallow.  However, reflux to the level of the cervical esophagus may certainly predispose pt to post-prandial aspiration.  Discussed results of barium swallow with pt and her granddaughter; they were provided with a handout that listed strategies to help live with reflux and promote comfort, increased PO intake.  Granddaughter verbalized understanding.  No further SLP f/u warranted - our services will sign off.    HPI HPI: [name] Flink is a 82 y.o. female with medical history significant of CAD, h/o aortic valve replacement, COPD on 4L home O2, HLD, and PAF on Coumadin presenting as a transfer from [facility] for breathing problems.  She reports that on October 9 she was living at home and was admitted to [facility] for CAP. She remained hospitalized until 10/13, including requirement of BIPAP.  Was on 4L home O2 prior to hospitalization.  Was having hallucinations, vivid dreams etc from Labor Day, trying to wean down her O2.  On 10/13, discharged [facility] for [facility]/OT, rehab.   Remained there until 11/10, developed respiratory distress.  [facility].  HCAP and CHF exacerbation (intermittently with exacerbations with build up of fluid in abdominal area).  Was hospitalized, given Lasix and volume restriction and also apparently treated with rocephin and azithromycin.  Plan was for discharge back to rehab on Monday AM but Sunday she had acute  decompensation and she was sent to the ICU.  Shortly after breakfast, she had an "episode - severe constriction in voice box region."  Did not require additional respiratory support, but was given Solumedrol.  Has been having some episodes of ?vocal cord spasm - difficulty catching her breath, trouble speaking.  Episodes may be associated with eating, result in airway obstruction. Swallow evaluation yesterday was normal as far as family knows.  She was sent here for endoscopic evaluation/bronch?  Does get winded while eating a meal.  Has had h/o esophageal dilation.       SLP Plan  Other (Comment) (education complete; no further f/u warranted)     Recommendations  Diet recommendations: Dysphagia 3 (mechanical soft);Thin liquid                Plan: Other (Comment) (education complete; no further f/u warranted)       GO               Jarod Bozzo L. Samson Fredericouture, [place] CCC/SLP Pager [phone]  Blenda MountsCouture, Serrina Minogue Laurice 12/26/2015, 2:41 PM

## 2015-12-26 NOTE — Progress Notes (Signed)
ANTICOAGULATION CONSULT NOTE - Follow Up Consult  Pharmacy Consult for warfarin Indication: atrial fibrillation  Allergies  Allergen Reactions  . Morphine Other (See Comments)    HALLUCINATIONS  . Tape Other (See Comments)    SKIN IS VERY THIN AND BRUISES EASILY!!    Patient Measurements: Height: 5' (152.4 cm) Weight: 144 lb 12.8 oz (65.7 kg) IBW/kg (Calculated) : 45.5  Vital Signs: Temp: 98.3 F (36.8 C) (11/17 0450) Temp Source: Oral (11/17 0450) BP: 113/44 (11/17 0450) Pulse Rate: 66 (11/17 0450)  Labs:  Recent Labs  12/23/15 2313 12/24/15 0336 12/25/15 0347 12/26/15 0534  HGB 9.2* 9.2* 9.7*  --   HCT 28.9* 29.0* 30.4*  --   PLT 294 307 302  --   LABPROT  --  35.7* 26.3* 21.7*  INR  --  3.47 2.36 1.86  CREATININE 0.80 0.75 1.00  --     Estimated Creatinine Clearance: 34.2 mL/min (by C-G formula based on SCr of 1 mg/dL).  Assessment: 82 yo f [facility]  PMH: CAD, Aortic Valve replacement, COPD, HLD, AFib on warfarin  Anticoag: warfarin pta for Afib, admit INR 3.47 INR subtherapeutic today 1.86  PT[facility] dose [facility] = warfarin 4 mg on Sun, M, W, F and 3 mg Sat, T,Th Last MD visit had 2.5 mg daily, will monitor daily until stable Nephro: Scr 1   Heme/Onc: H&H 9.7/30.4, Plt 302  Goal of Therapy:  INR 2-3 Monitor platelets by anticoagulation protocol: Yes   Plan:  Warfarin 5 mg x 1 Monitor daily INR, CBC q 72 hr, clinical course, s/sx of bleed, PO intake, DDI   Thank you for allowing us to participate in this patients care. Signe Coltonya C Pleasant Britz, PharmD Pager: [phone] 12/26/2015 9:03 AM

## 2015-12-26 NOTE — Progress Notes (Signed)
[facility] to give [facility]. No one was available to pick the calls. We'll call again in another minutes.

## 2015-12-26 NOTE — Discharge Summary (Addendum)
[facility] Discharge Summary   Patient: [name]:[id number]   PCP: Kirstie Peri, MD DOB: Feb 10, 1927   Date of admission: 12/23/2015   Date of discharge:  12/26/2015    Discharge Diagnoses:  Principal Problem:   Acute on chronic respiratory failure (HCC) Active Problems:   HLD (hyperlipidemia)   CAD, NATIVE VESSEL   H/O aortic valve replacement   Paroxysmal atrial fibrillation (HCC)   COPD (chronic obstructive pulmonary disease) (HCC)   Hiatal hernia with GERD   Aspiration pneumonia (HCC)  Admitted From: SNF Disposition:  SNF  Recommendations for Outpatient Follow-up:  1. Please follow up with PCP in 1 week.   Follow-up Information    SHAH,ASHISH, MD. Schedule an appointment as soon as possible for a visit in 1 week(s).   Specialty:  Internal Medicine Contact information: [address] [place] [address] [phone]          Diet recommendation: Small frequent meals, must be up for 2 hours after the meal. Dysphagia 3 (Mech soft);Thin liquid  Liquid Administration via: Straw;Cup Medication Administration: Whole meds with liquid (As tolerated, crush if needed) Supervision: Staff to assist with self feeding Compensations: Minimize environmental distractions;Slow rate;Small sips/bites;Follow solids with liquid Postural Changes: Seated upright at 90 degrees   Activity: The patient is advised to gradually reintroduce usual activities.  Discharge Condition: good  Code Status: Full code  History of present illness: As per the H and P dictated on admission, "[name] is a 82 y.o. female with medical history significant of CAD, h/o aortic valve replacement, COPD on 4L home O2, HLD, and PAF on Coumadin presenting as a transfer from [facility] for breathing problems.  She reports that on October 9 she was living at home and was admitted to [facility] for CAP. She remained hospitalized until 10/13, including requirement of BIPAP.  Was on 4L  home O2 prior to hospitalization.  Was having hallucinations, vivid dreams etc from Labor Day, trying to wean down her O2.  On 10/13, discharged to [facility] for PT/OT, rehab.   Remained there until 11/10, developed respiratory distress.  Transported back to [facility] ER.  HCAP and CHF exacerbation (intermittently with exacerbations with build up of fluid in abdominal area).  Was hospitalized, given Lasix and volume restriction and also apparently treated with rocephin and azithromycin.  Plan was for discharge back to rehab on Monday AM but Sunday she had acute decompensation and she was sent to the ICU.  Shortly after breakfast, she had an "episode - severe constriction in voice box region."  Did not require additional respiratory support, but was given Solumedrol.  Has been having some episodes of ?vocal cord spasm - difficulty catching her breath, trouble speaking.  Episodes may be associated with eating, result in airway obstruction. Swallow evaluation yesterday was normal as far as family knows.  She was sent here for endoscopic evaluation/bronch?  Does get winded while eating a meal.  Has had h/o esophageal dilation"  Hospital Course:   Summary of her active problems in the hospital is as following.   Acute on chronic respiratory failure    Hiatal hernia with GERD   Aspiration pneumonia (HCC)   COPD/Chronic Resp failure  -due to Aspiration pneumonia and COPD -has chronic resp failure on 3L Home O2 -Started on Cefepime, Vancomycin; changing to Augmentin to finish a treatment course of 10 days. -Esophagram with large hiatal hernia and GERD, no mass or stricture and normal motility -s/p SLP evaluation, mild aspiration  risk, D3 diet with thin liquids recommended -Resp culture normal flora, legionella and strep antigens are negative. Negative blood culture for 48 hours -continue PPI for GERD, changed to BID -d/w with daughter in law and part PoA [name] that due to advanced age, GERD,  hiatal hernia and Dyshagia pt will be a aspiration risk long term. Family is already aware that the patient is not an ideal candidate for any surgical procedure given her advance age, COPD as well as AAA. Family is realistic but the patient is not. Recommend medical management for GERD at present. I discussed with GI as well as general surgery regarding her hiatal hernia.    H/O aortic valve replacement bioprosthetic valve -bovine in 2005    Paroxysmal atrial fibrillation (HCC) -on warfarin     HLD (hyperlipidemia) On Lipitor 10 mg daily.    CAD, NATIVE VESSEL No active issues.    Paroxysmal atrial fibrillation (HCC)  On Coumadin, continue. He didn't recheck INR tomorrow.  All other chronic medical condition were stable during the hospitalization.  Patient was seen by physical therapy, who recommended SNF, which was arranged by Child psychotherapistsocial worker and case Production designer, theatre/television/filmmanager. On the day of the discharge the patient's vitals were stable, and no other acute medical condition were reported by patient. the patient was felt safe to be discharge at SNF with SNF.  Procedures and Results:  Esophagogram   Consultations:  None  DISCHARGE MEDICATION: Current Discharge Medication List    START taking these medications   Details  amoxicillin-clavulanate (AUGMENTIN) 500-125 MG tablet Take 1 tablet (500 mg total) by mouth every 8 (eight) hours. Qty: 18 tablet, Refills: 0    atorvastatin (LIPITOR) 10 MG tablet Take 1 tablet (10 mg total) by mouth daily at 6 PM. Qty: 30 tablet, Refills: 0      CONTINUE these medications which have CHANGED   Details  furosemide (LASIX) 80 MG tablet Take 0.5 tablets (40 mg total) by mouth 2 (two) times daily. FOR EDEMA Qty: 60 tablet, Refills: 0    HYDROcodone-acetaminophen (NORCO/VICODIN) 5-325 MG tablet Take 1 tablet by mouth every 8 (eight) hours as needed (for pain). Qty: 6 tablet, Refills: 0    metoCLOPramide (REGLAN) 10 MG tablet Take 0.5 tablets (5 mg total)  by mouth 4 (four) times daily. Qty: 30 tablet, Refills: 0      CONTINUE these medications which have NOT CHANGED   Details  albuterol (PROVENTIL) (2.5 MG/3ML) 0.083% nebulizer solution Take 2.5 mg by nebulization See admin instructions. Every six hours for 10 days starting on 12/18/15, then back to every 6 hours as needed for wheezing or shortness of breath    calcium-vitamin D (OSCAL WITH D) 500-200 MG-UNIT tablet Take 1 tablet by mouth 2 (two) times daily.    citalopram (CELEXA) 40 MG tablet Take 40 mg by mouth every morning.    diltiazem (CARDIZEM CD) 120 MG 24 hr capsule Take 120 mg by mouth every morning.     fluticasone furoate-vilanterol (BREO ELLIPTA) 200-25 MCG/INH AEPB Inhale 1 puff into the lungs daily.    isosorbide mononitrate (IMDUR) 30 MG 24 hr tablet Take 30 mg by mouth every morning.     Multiple Vitamins-Minerals (ONE-A-DAY WOMENS 50+ ADVANTAGE) TABS Take 1 tablet by mouth daily.    omeprazole (PRILOSEC) 20 MG capsule Take 1 capsule by mouth daily before breakfast.     potassium chloride (K-DUR,KLOR-CON) 10 MEQ tablet Take 10 mEq by mouth 2 (two) times daily.    rOPINIRole (REQUIP) 0.5 MG  tablet Take 1 tablet by mouth 3 (three) times daily.     Vitamin D, Ergocalciferol, (DRISDOL) 50000 units CAPS capsule Take 50,000 Units by mouth every Monday.    !! warfarin (COUMADIN) 3 MG tablet Take 3 mg by mouth See admin instructions. In the evening at 5 PM on Sat/Tues/Thurs    !! warfarin (COUMADIN) 4 MG tablet Take 4 mg by mouth See admin instructions. In the evening at 5 PM on Sun/Mon/Wed/Fri    budesonide-formoterol (SYMBICORT) 80-4.5 MCG/ACT inhaler Inhale 2 puffs into the lungs 2 (two) times daily.    guaiFENesin (MUCINEX) 600 MG 12 hr tablet Take 600 mg by mouth 2 (two) times daily.    ipratropium-albuterol (DUONEB) 0.5-2.5 (3) MG/3ML SOLN Take 3 mLs by nebulization 4 (four) times daily.     nitroGLYCERIN (NITROSTAT) 0.4 MG SL tablet Place 0.4 mg under the tongue  every 5 (five) minutes as needed for chest pain.     simvastatin (ZOCOR) 20 MG tablet TAKE ONE TABLET BY MOUTH IN THE EVENING Qty: 30 tablet, Refills: 0     !! - Potential duplicate medications found. Please discuss with provider.    STOP taking these medications     ciprofloxacin (CIPRO) 250 MG tablet      LORazepam (ATIVAN) 0.5 MG tablet      predniSONE (DELTASONE) 10 MG tablet        Allergies  Allergen Reactions  . Morphine Other (See Comments)    HALLUCINATIONS  . Tape Other (See Comments)    SKIN IS VERY THIN AND BRUISES EASILY!!   Discharge Instructions    DIET DYS 3    Complete by:  As directed    Fluid consistency:  Thin   Dysphagia 3 (Mech soft);Thin liquid   Liquid Administration via: Straw;Cup Medication Administration: Whole meds with liquid (As tolerated, crush if needed) Supervision: Staff to assist with self feeding Compensations: Minimize environmental distractions;Slow rate;Small sips/bites;Follow solids with liquid Postural Changes: Seated upright at 90 degrees, at least 2 hours after the meals.   Diet - low sodium heart healthy    Complete by:  As directed    Elevate head of bed    Complete by:  As directed    Increase activity slowly    Complete by:  As directed    Must be up for meals    Complete by:  As directed      Discharge Exam: Filed Weights   12/24/15 1152 12/25/15 1559 12/26/15 0645  Weight: 68 kg (150 lb) 65.5 kg (144 lb 6.4 oz) 65.7 kg (144 lb 12.8 oz)   Vitals:   12/26/15 0937 12/26/15 1330  BP: (!) 104/53 (!) 120/53  Pulse:  69  Resp:  16  Temp:  98 F (36.7 C)   General: Appear in no distress, no Rash; Oral Mucosa moist. Cardiovascular: S1 and S2 Present, no Murmur, no JVD Respiratory: Bilateral Air entry present and Clear to Auscultation, no Crackles, no wheezes Abdomen: Bowel Sound present, Soft and no tenderness Extremities: no Pedal edema, no calf tenderness Neurology: Grossly no focal neuro deficit.  The results  of significant diagnostics from this hospitalization (including imaging, microbiology, ancillary and laboratory) are listed below for reference.    Significant Diagnostic Studies: Ct Chest Wo Contrast  Result Date: 12/24/2015 CLINICAL DATA:  Acute onset of shortness of breath and productive cough. Initial encounter. EXAM: CT CHEST WITHOUT CONTRAST TECHNIQUE: Multidetector CT imaging of the chest was performed following the standard protocol without IV contrast. COMPARISON:  Chest radiograph performed 12/21/2015, and CTA of the chest performed 11/07/2012 FINDINGS: Cardiovascular: The patient is status post median sternotomy. Diffuse calcification is noted along the thoracic aorta. Diffuse coronary artery calcifications are seen. Mild left atrial enlargement is noted. Scattered calcification is noted along the great vessels. Mediastinum/Nodes: A 1.2 cm precarinal node is noted. No pericardial effusion is identified. The visualized portions of the thyroid gland are unremarkable. No axillary lymphadenopathy is appreciated. Lungs/Pleura: There is opacification of the bronchioles to the right lower lobe, with underlying airspace opacification, compatible with aspiration pneumonia. Mild patchy left basilar opacity may also reflect aspiration pneumonia. Underlying bilateral emphysema is noted. No dominant mass is seen. No definite pleural effusion or pneumothorax is seen. Upper Abdomen: The visualized portions of the liver and spleen are unremarkable. The patient is status post cholecystectomy, with clips noted along the gallbladder fossa. The visualized portions of the pancreas and left adrenal gland are unremarkable. The visualized portions of the kidneys are grossly unremarkable. A 2.2 cm right adrenal adenoma is noted. A small hiatal hernia is seen. Musculoskeletal: No acute osseous abnormalities are identified. The visualized musculature is unremarkable in appearance. IMPRESSION: 1. Opacification of the  bronchioles to the right lower lung lobe, with underlying airspace opacification, compatible with aspiration pneumonia. Mild patchy left basilar opacity may also reflect aspiration pneumonia. 2. Underlying bilateral emphysema noted. 3. 1.2 cm precarinal node noted. 4. Mild left atrial enlargement seen. 5. Diffuse coronary artery calcifications seen. 6. Diffuse thoracic aortic atherosclerosis noted. 7. 2.2 cm right adrenal adenoma noted. 8. Small hiatal hernia seen. Electronically Signed   By: Roanna Raider M.D.   On: 12/24/2015 03:30   Dg Esophagus  Result Date: 12/24/2015 CLINICAL DATA:  Gastroesophageal reflux disease. Difficulty swallowing. EXAM: ESOPHOGRAM/BARIUM SWALLOW WITH BARIUM TABLET TECHNIQUE: Single contrast examination was performed using thin and thick barium and a 13 mm barium tablet. FLUOROSCOPY TIME:  Fluoroscopy Time:  1 minutes 42 seconds COMPARISON:  None. FINDINGS: The oropharyngeal swallowing mechanisms appear normal. There are good primary and secondary stripping waves with no stricture or mass. There is a 5 cm hiatal hernia. The 13 mm barium tablet passed immediately from the mouth into the hiatal hernia without delay. When the patient was placed semi recumbent she had extensive gastroesophageal reflux to the level of the lower cervical spine well above the thoracic inlet. IMPRESSION: 1. 5 cm hiatal hernia with extensive gastroesophageal reflux into the cervical esophagus when the patient was semi recumbent. 2. No stricture or mass.  Normal esophageal motility. Electronically Signed   By: Francene Boyers M.D.   On: 12/24/2015 12:10    Microbiology: Recent Results (from the past 240 hour(s))  MRSA PCR Screening     Status: None   Collection Time: 12/23/15  7:00 PM  Result Value Ref Range Status   MRSA by PCR NEGATIVE NEGATIVE Final    Comment:        The GeneXpert MRSA Assay (FDA approved for NASAL specimens only), is one component of a comprehensive MRSA  colonization surveillance program. It is not intended to diagnose MRSA infection nor to guide or monitor treatment for MRSA infections.   Culture, blood (routine x 2) Call MD if unable to obtain prior to antibiotics being given     Status: None (Preliminary result)   Collection Time: 12/23/15 11:05 PM  Result Value Ref Range Status   Specimen Description BLOOD RIGHT ARM  Final   Special Requests IN PEDIATRIC BOTTLE  Final   Culture NO  GROWTH 2 DAYS  Final   Report Status PENDING  Incomplete  Culture, blood (routine x 2) Call MD if unable to obtain prior to antibiotics being given     Status: None (Preliminary result)   Collection Time: 12/23/15 11:12 PM  Result Value Ref Range Status   Specimen Description BLOOD RIGHT HAND  Final   Special Requests IN PEDIATRIC BOTTLE  Final   Culture NO GROWTH 2 DAYS  Final   Report Status PENDING  Incomplete  Culture, sputum-assessment     Status: None   Collection Time: 12/24/15  9:30 AM  Result Value Ref Range Status   Specimen Description EXPECTORATED SPUTUM  Final   Special Requests Normal  Final   Sputum evaluation   Final    THIS SPECIMEN IS ACCEPTABLE. RESPIRATORY CULTURE REPORT TO FOLLOW.   Report Status 12/24/2015 FINAL  Final  Culture, respiratory (NON-Expectorated)     Status: None   Collection Time: 12/24/15  9:30 AM  Result Value Ref Range Status   Specimen Description EXPECTORATED SPUTUM  Final   Special Requests NONE  Final   Gram Stain   Final    FEW WBC PRESENT, PREDOMINANTLY PMN ABUNDANT GRAM POSITIVE RODS FEW GRAM POSITIVE COCCI IN CLUSTERS FEW GRAM NEGATIVE COCCOBACILLI    Culture Consistent with normal respiratory flora.  Final   Report Status 12/26/2015 FINAL  Final     Labs: CBC:  Recent Labs Lab 12/23/15 2313 12/24/15 0336 12/25/15 0347  WBC 7.7 7.6 7.7  NEUTROABS 7.1 6.8  --   HGB 9.2* 9.2* 9.7*  HCT 28.9* 29.0* 30.4*  MCV 90.6 91.5 91.0  PLT 294 307 302   Basic Metabolic Panel:  Recent  Labs Lab 12/23/15 2313 12/24/15 0336 12/25/15 0347  NA 133* 135 139  K 4.3 4.4 3.9  CL 96* 96* 98*  CO2 30 33* 37*  GLUCOSE 128* 119* 82  BUN 32* 28* 33*  CREATININE 0.80 0.75 1.00  CALCIUM 8.2* 8.3* 7.9*  MG 2.6*  --   --   PHOS 2.9  --   --    Liver Function Tests:  Recent Labs Lab 12/23/15 2313  AST 36  ALT 55*  ALKPHOS 53  BILITOT 0.4  PROT 5.3*  ALBUMIN 2.9*   BNP (last 3 results)  Recent Labs  12/23/15 2313  BNP 426.0*   CBG: No results for input(s): GLUCAP in the last 168 hours. Time spent: 30 minutes  Signed:  Lynden Oxford  [facility]  12/26/2015  , 2:48 PM

## 2015-12-28 LAB — CULTURE, BLOOD (ROUTINE X 2)
CULTURE: NO GROWTH
Culture: NO GROWTH

## 2016-03-11 DEATH — deceased

## 2018-04-14 IMAGING — RF DG ESOPHAGUS
7 of 8 series · 15 of 21 positions shown · non-contrast
Comparison: None.

CLINICAL DATA: Gastroesophageal reflux disease. Difficulty
swallowing.

EXAM:
ESOPHOGRAM/BARIUM SWALLOW WITH BARIUM TABLET
TECHNIQUE: Single contrast examination was performed using thin and thick
barium and a 13 mm barium tablet.
FLUOROSCOPY TIME:  Fluoroscopy Time:  1 minutes 42 seconds

[Series 1: cp_standard · 0.57mm/px · 3 of 163 frames shown (1 of 6)]
[frame 16/163]
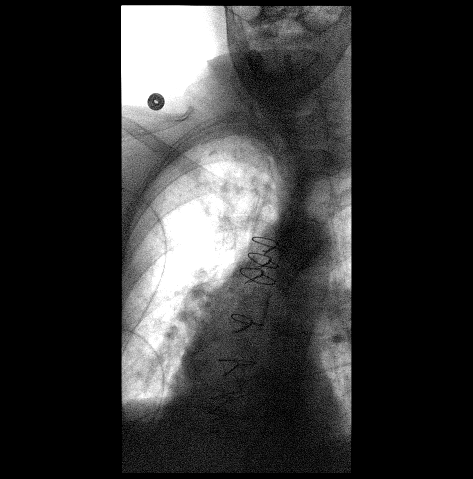
[frame 82/163]
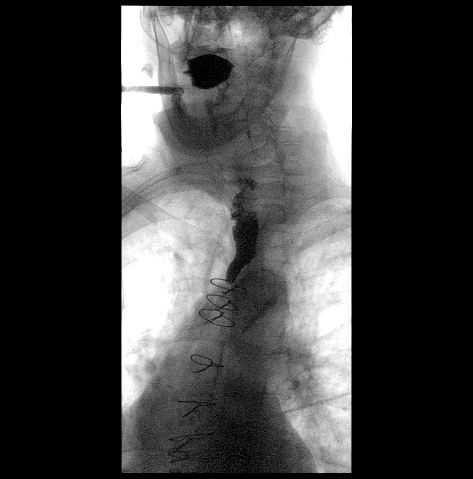
[frame 139/163]
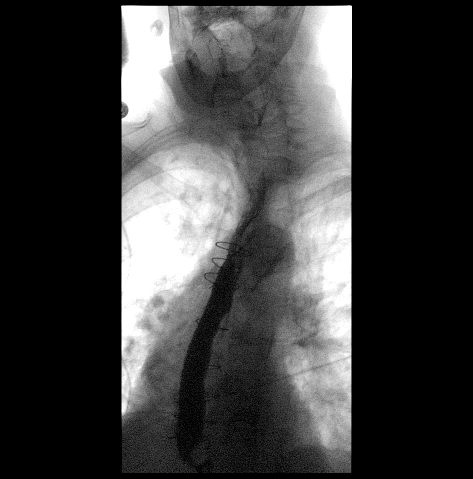

[Series 2: cp_standard · 0.57mm/px · 3 of 97 frames shown (2 of 6)]
[frame 15/97]
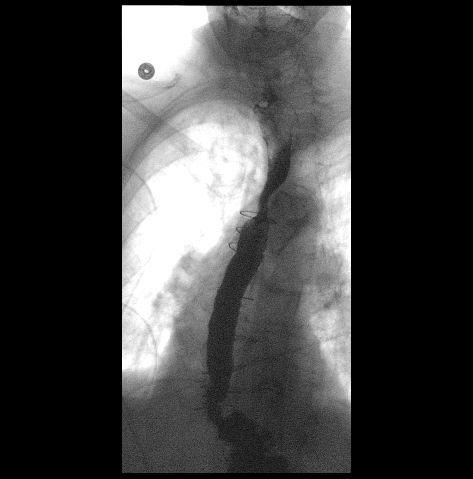
[frame 49/97]
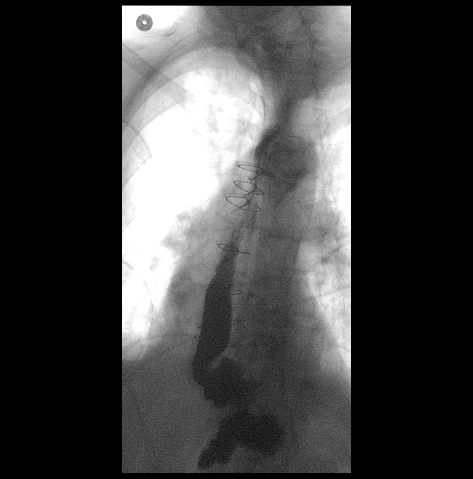
[frame 83/97]
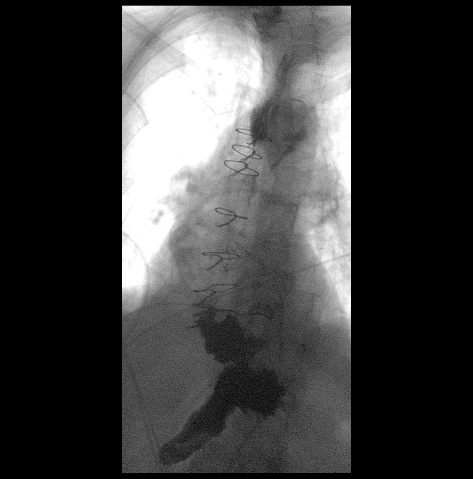

[Series 3: cp_standard · 0.57mm/px · 3 of 122 frames shown (3 of 6)]
[frame 62/122]
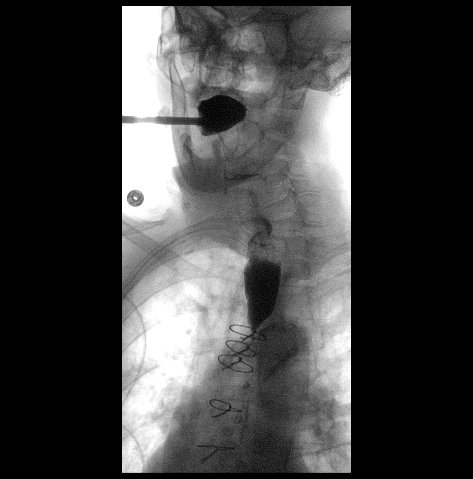
[frame 93/122]
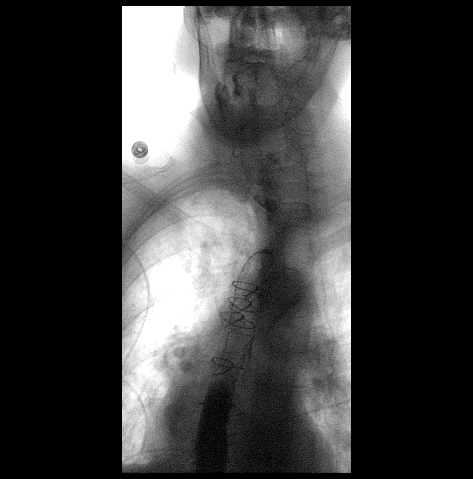
[frame 104/122]
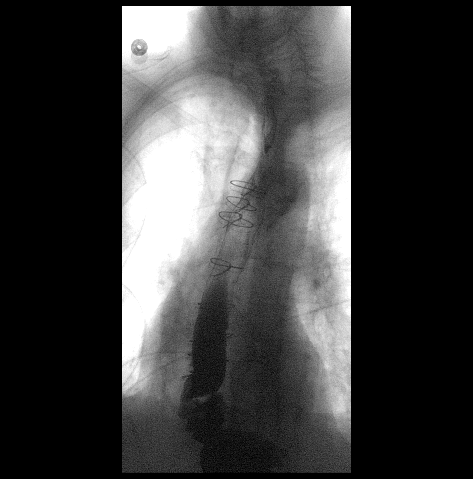

[Series 5: cp_standard · 0.29mm/px · 1 of 1 slices shown (4 of 6)]
[im 1/1]
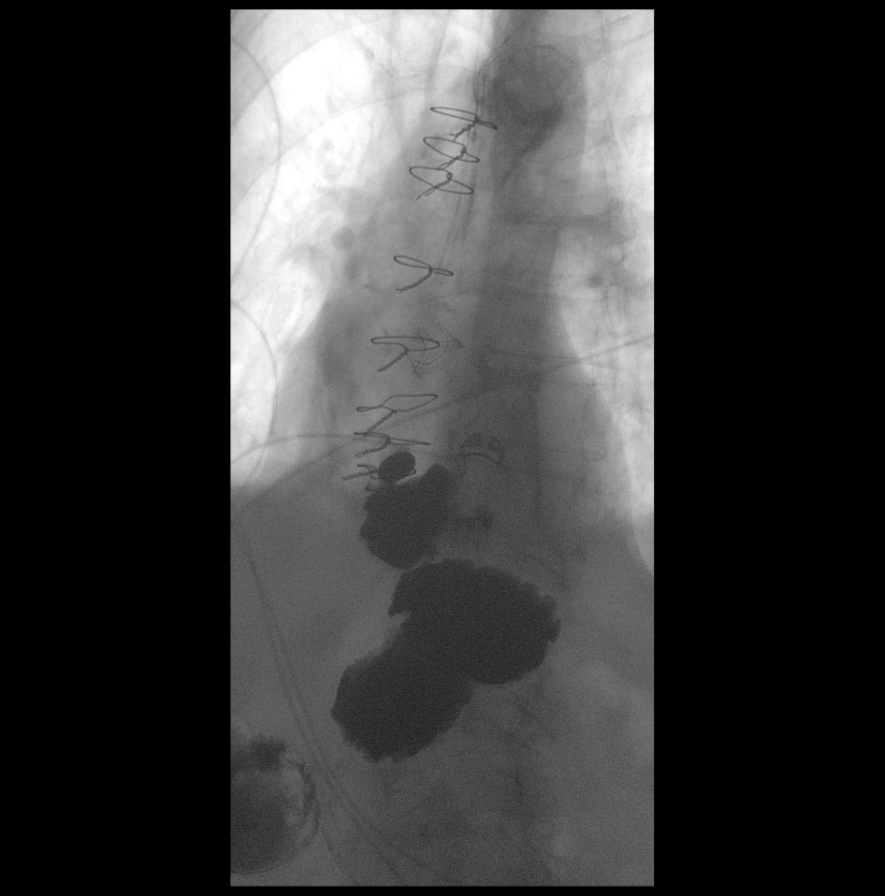

[Series 6: cp_standard · 0.29mm/px · 1 of 1 slices shown (5 of 6)]
[im 1/1]
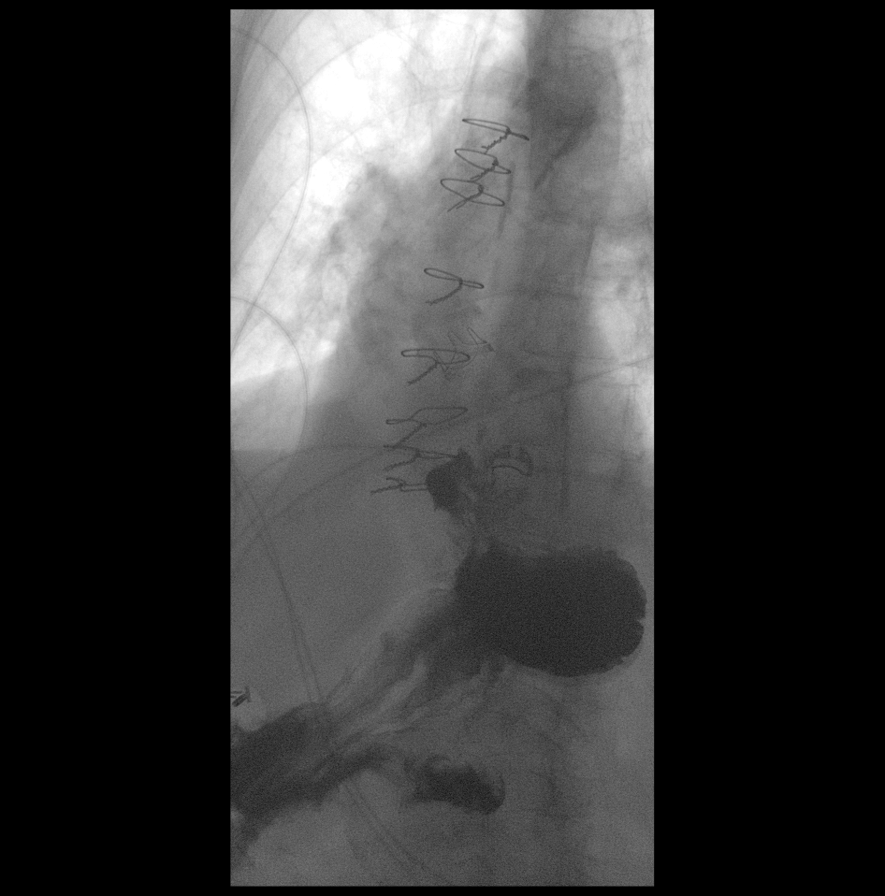

[Series 7: fluoro_barium 2fps_bw · 0.20mm/px · 1 of 2 frames shown]
[frame 2/2]
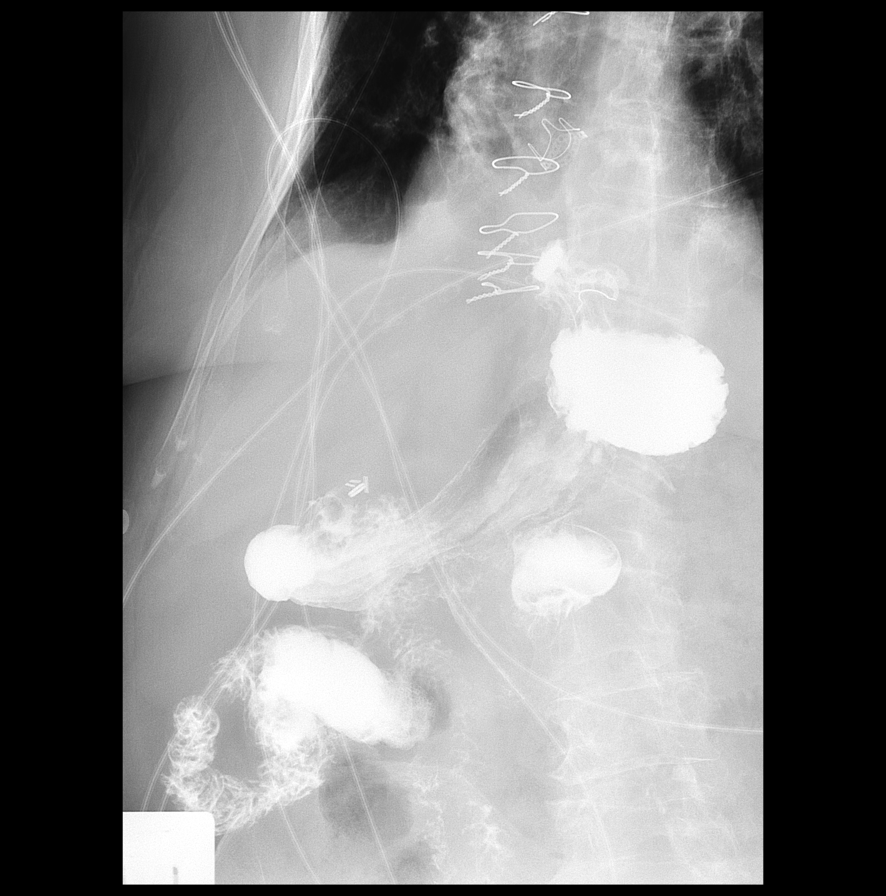

[Series 8: cp_standard · 0.60mm/px · 3 of 45 frames shown (6 of 6)]
[frame 7/45]
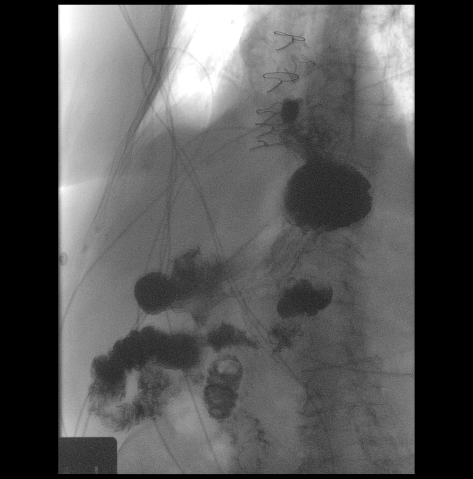
[frame 23/45]
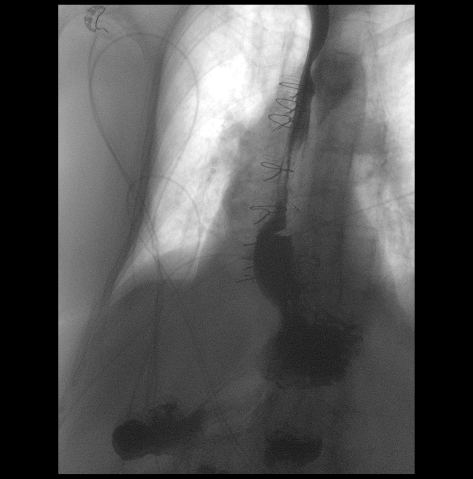
[frame 39/45]
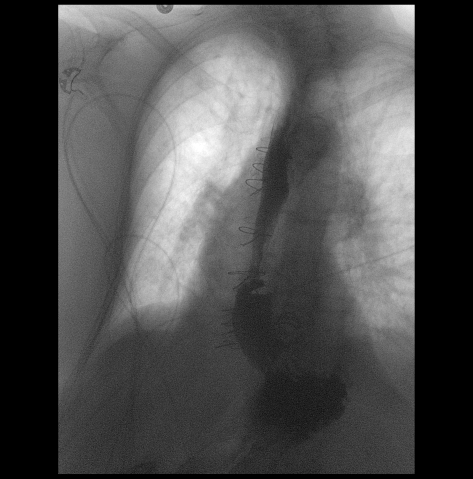

[15 of 21 positions shown; findings below may reference images not displayed]

FINDINGS: The oropharyngeal swallowing mechanisms appear normal. There are
good primary and secondary stripping waves with no stricture or
mass. There is a 5 cm hiatal hernia.

The 13 mm barium tablet passed immediately from the mouth into the
hiatal hernia without delay. When the patient was placed semi
recumbent she had extensive gastroesophageal reflux to the level of
the lower cervical spine well above the thoracic inlet.
IMPRESSION: 1. 5 cm hiatal hernia with extensive gastroesophageal reflux into
the cervical esophagus when the patient was semi recumbent.
2. No stricture or mass.  Normal esophageal motility.
